# Patient Record
Sex: Male | Born: 1965 | Race: Black or African American | Hispanic: No | Marital: Married | Smoking: Current every day smoker
Health system: Southern US, Community
[De-identification: ages and names within clinical notes are randomized; demographics above are authoritative.]

## PROBLEM LIST (undated history)

## (undated) DIAGNOSIS — F4310 Post-traumatic stress disorder, unspecified: Secondary | ICD-10-CM

## (undated) DIAGNOSIS — R519 Headache, unspecified: Secondary | ICD-10-CM

## (undated) DIAGNOSIS — M1990 Unspecified osteoarthritis, unspecified site: Secondary | ICD-10-CM

## (undated) DIAGNOSIS — I10 Essential (primary) hypertension: Secondary | ICD-10-CM

## (undated) DIAGNOSIS — K219 Gastro-esophageal reflux disease without esophagitis: Secondary | ICD-10-CM

## (undated) HISTORY — PX: SHOULDER SURGERY: SHX246

## (undated) HISTORY — DX: Unspecified osteoarthritis, unspecified site: M19.90

## (undated) HISTORY — PX: FOOT SURGERY: SHX648

## (undated) HISTORY — DX: Gastro-esophageal reflux disease without esophagitis: K21.9

## (undated) HISTORY — PX: NECK SURGERY: SHX720

## (undated) HISTORY — DX: Headache, unspecified: R51.9

---

## 2021-05-13 ENCOUNTER — Ambulatory Visit (HOSPITAL_COMMUNITY)
Admission: EM | Admit: 2021-05-13 | Discharge: 2021-05-13 | Disposition: A | Discharge status: Home / Self Care | Payer: Self-pay

## 2021-05-13 ENCOUNTER — Other Ambulatory Visit (HOSPITAL_COMMUNITY): Payer: Self-pay

## 2021-05-13 ENCOUNTER — Encounter (HOSPITAL_COMMUNITY): Payer: Self-pay

## 2021-05-13 DIAGNOSIS — R109 Unspecified abdominal pain: Secondary | ICD-10-CM

## 2021-05-13 HISTORY — DX: Post-traumatic stress disorder, unspecified: F43.10

## 2021-05-13 HISTORY — DX: Essential (primary) hypertension: I10

## 2021-05-13 MED ORDER — KETOROLAC TROMETHAMINE 30 MG/ML IJ SOLN
30.0000 mg | Freq: Once | INTRAMUSCULAR | Status: AC
  Administered 2021-05-13: 30 mg via INTRAMUSCULAR

## 2021-05-13 MED ORDER — KETOROLAC TROMETHAMINE 30 MG/ML IJ SOLN
INTRAMUSCULAR | Status: AC
  Filled 2021-05-13: qty 1

## 2021-05-13 MED ORDER — DICLOFENAC SODIUM 75 MG PO TBEC
75.0000 mg | DELAYED_RELEASE_TABLET | Freq: Two times a day (BID) | ORAL | 0 refills | Status: DC
  Filled 2021-05-13: qty 30, 15d supply, fill #0

## 2021-05-13 MED ORDER — CYCLOBENZAPRINE HCL 10 MG PO TABS
10.0000 mg | ORAL_TABLET | Freq: Every day | ORAL | 0 refills | Status: DC
  Filled 2021-05-13: qty 10, 10d supply, fill #0

## 2021-05-13 NOTE — Discharge Instructions (Signed)
Your pain is most likely caused by irritation to the muscles or ligaments.   Take diclofenac twice a day for the next 5 days you may use medication as needed  You may use muscle relaxer at bedtime for additional comfort and to help you sleep  You may use heating pad in 15 minute intervals as needed for additional comfort, or you may find comfort in using ice in 10-15 minutes over affected area  Begin stretching affected area daily for 10 minutes as tolerated to further loosen muscles   When lying down place pillow underneath affected area for support  Can try sleeping without pillow on firm mattress   Practice good posture: head back, shoulders back, chest forward, pelvis back and weight distributed evenly on both legs  If pain persist after recommended treatment or reoccurs if may be beneficial to follow up with orthopedic specialist for evaluation, this doctor specializes in the bones and can manage your symptoms long-term with options such as but not limited to imaging, medications or physical therapy   A primary care referral has been placed for you so that your blood pressure to be managed or you can wait until you are seen at the [facility] hospital

## 2021-05-13 NOTE — ED Provider Notes (Signed)
[place]    CSN: [id number] Arrival date & time: 05/13/21  [id number]      History   Chief Complaint Chief Complaint  Patient presents with   side pain    left    HPI Wayne Bartlett is a 56 y.o. male.   Patient presents with constant left-sided flank pain for 2 days.  Symptoms began abruptly without precipitating event, injury or trauma.  Interfering with sleep, unable to lay directly onto left side.  Pain does not radiate.  Rating a 9 out of 10 described as a pulling sensation.  Has attempted use of Advil and Tiger balm which was not helpful.  History of hypertension, not currently on medication and PTSD.  Denies numbness, tingling, urinary or bowel changes, nausea, vomiting, diarrhea, fever, chills.   Past Medical History:  Diagnosis Date   Hypertension    PTSD (post-traumatic stress disorder)     There are no problems to display for this patient.   Past Surgical History:  Procedure Laterality Date   FOOT SURGERY     NECK SURGERY     SHOULDER SURGERY         Home Medications    Prior to Admission medications   Medication Sig Start Date End Date Taking? Authorizing Provider  calcium carbonate (OS-CAL) 1250 (500 Ca) MG chewable tablet Chew 1 tablet by mouth daily.   Yes [provider]  oxyCODONE-acetaminophen (PERCOCET) 10-325 MG tablet Take 1 tablet by mouth every 4 (four) hours as needed for pain.   Yes [provider]  potassium phosphate, monobasic, (K-PHOS ORIGINAL) 500 MG tablet Take 500 mg by mouth 4 (four) times daily -  with meals and at bedtime.   Yes [provider]    Family History Family History  Problem Relation Age of Onset   Alzheimer's disease Mother    Stroke Father     Social History Social History   Tobacco Use   Smoking status: Every Day    Types: Cigarettes, Cigars   Smokeless tobacco: Never  Substance Use Topics   Alcohol use: Yes     Allergies   Patient has no known  allergies.   Review of Systems Review of Systems  Constitutional: Negative.   HENT: Negative.    Respiratory: Negative.    Cardiovascular: Negative.   Gastrointestinal: Negative.   Genitourinary:  Positive for flank pain. Negative for decreased urine volume, difficulty urinating, dysuria, enuresis, frequency, genital sores, hematuria, penile discharge, penile pain, penile swelling, scrotal swelling, testicular pain and urgency.  Skin: Negative.   Neurological: Negative.   Psychiatric/Behavioral: Negative.      Physical Exam Triage Vital Signs ED Triage Vitals  Enc Vitals Group     BP 05/13/21 0837 (!) 162/96     Pulse Rate 05/13/21 0837 92     Resp 05/13/21 0837 14     Temp 05/13/21 0837 99.4 F (37.4 C)     Temp Source 05/13/21 0837 Oral     SpO2 05/13/21 0837 96 %     Weight 05/13/21 0838 210 lb (95.3 kg)     Height --      Head Circumference --      Peak Flow --      Pain Score 05/13/21 0838 6     Pain Loc --      Pain Edu? --      Excl. in [place]? --    No data found.  Updated Vital Signs BP (!) 162/96 (BP Location:  Right Arm)    Pulse 92    Temp 99.4 F (37.4 C) (Oral)    Resp 14    Wt 210 lb (95.3 kg)    SpO2 96%   Visual Acuity Right Eye Distance:   Left Eye Distance:   Bilateral Distance:    Right Eye Near:   Left Eye Near:    Bilateral Near:     Physical Exam Constitutional:      Appearance: Normal appearance. He is normal weight.  HENT:     Head: Normocephalic.  Eyes:     Extraocular Movements: Extraocular movements intact.  Cardiovascular:     Rate and Rhythm: Normal rate and regular rhythm.     Pulses: Normal pulses.     Heart sounds: Normal heart sounds.  Pulmonary:     Effort: Pulmonary effort is normal.     Breath sounds: Normal breath sounds.  Chest:       Comments: Point tenderness over the ribs 5 through 6 on the left flank underneath, no ecchymosis, crepitus, swelling or deformity noted  Abdominal:     General: Abdomen is flat.  Bowel sounds are normal.     Tenderness: There is no abdominal tenderness.  Skin:    General: Skin is warm and dry.  Neurological:     Mental Status: He is alert and oriented to person, place, and time. Mental status is at baseline.  Psychiatric:        Mood and Affect: Mood normal.        Behavior: Behavior normal.     UC Treatments / Results  Labs (all labs ordered are listed, but only abnormal results are displayed) Labs Reviewed - No data to display  EKG   Radiology No results found.  Procedures Procedures (including critical care time)  Medications Ordered in UC Medications - No data to display  Initial Impression / Assessment and Plan / UC Course  I have reviewed the triage vital signs and the nursing notes.  Pertinent labs & imaging results that were available during my care of the patient were reviewed by me and considered in my medical decision making (see chart for details).  Acute left flank pain  Low suspicion of involvement of the lungs, bladder, kidneys or abdomen will manage conservatively move forward with treatment as musculoskeletal pain, Toradol injection given in office, prescribed diclofenac and Flexeril for outpatient management, RICE recommended, heat in 15-minute intervals, pillows for support and daily stretching as tolerated, may follow-up with urgent care or orthopedic specialist for further evaluation as needed, PCP referral placed for management of blood pressure, blood pressure in triage 162/96, no signs of hypertensive emergency Final Clinical Impressions(s) / UC Diagnoses   Final diagnoses:  None   Discharge Instructions   None    ED Prescriptions   None    PDMP not reviewed this encounter.   Hans Eden, NP 05/13/21 [phone]

## 2021-05-13 NOTE — ED Triage Notes (Signed)
Pt presents with left side pain x 2 days. Pt taking advil for pain. Pt states he takes medication for HTN, PTSD but is unsure of name/dose

## 2021-09-18 ENCOUNTER — Other Ambulatory Visit (HOSPITAL_BASED_OUTPATIENT_CLINIC_OR_DEPARTMENT_OTHER): Payer: Self-pay | Admitting: Internal Medicine

## 2021-09-18 ENCOUNTER — Other Ambulatory Visit (HOSPITAL_COMMUNITY): Payer: Self-pay | Admitting: Internal Medicine

## 2021-09-18 DIAGNOSIS — M25511 Pain in right shoulder: Secondary | ICD-10-CM

## 2021-09-26 ENCOUNTER — Ambulatory Visit (HOSPITAL_BASED_OUTPATIENT_CLINIC_OR_DEPARTMENT_OTHER)
Admission: RE | Admit: 2021-09-26 | Discharge: 2021-09-26 | Disposition: A | Discharge status: Home / Self Care | Payer: No Typology Code available for payment source | Source: Ambulatory Visit | Attending: Internal Medicine | Admitting: Internal Medicine

## 2021-09-26 DIAGNOSIS — M25511 Pain in right shoulder: Secondary | ICD-10-CM | POA: Diagnosis not present

## 2022-05-29 NOTE — Therapy (Incomplete)
OUTPATIENT PHYSICAL THERAPY SHOULDER EVALUATION   Patient Name: Wayne Bartlett MRN: [id number] DOB:02-22-1966, 57 y.o., male Today's Date: 06/01/2022  END OF SESSION:   Past Medical History:  Diagnosis Date   Hypertension    PTSD (post-traumatic stress disorder)    Past Surgical History:  Procedure Laterality Date   FOOT SURGERY     NECK SURGERY     SHOULDER SURGERY     There are no problems to display for this patient.   PCP: none in chart  REFERRING PROVIDER: Diamond Nickel, PA-C   REFERRING DIAG: right shoulder pain   THERAPY DIAG:  No diagnosis found.  Rationale for Evaluation and Treatment: Rehabilitation  ONSET DATE: 04/22/22 s/p R shoulder arthroscopy + balloon arthroplasty  SUBJECTIVE:                                                                                                                                                                                      SUBJECTIVE STATEMENT: ***  PERTINENT HISTORY: Unremarkable per chart review  PAIN:  Are you having pain: *** Location: *** How would you describe your pain? *** Best in past week: *** Worst in past week: *** Aggravating factors: *** Easing factors: ***   PRECAUTIONS: per referral: PROM POD0 AAROM at 4 weeks (05/20/22) AROM 6 weeks 06/03/22  WEIGHT BEARING RESTRICTIONS: No  FALLS:  Has patient fallen in last 6 months? {fallsyesno:27318}  LIVING ENVIRONMENT: Lives with: {[facility] lives with:25569::"lives with their family"} Lives in: {Lives in:25570} Stairs: {opstairs:27293} Has following equipment at home: {Assistive devices:23999}  OCCUPATION: ***  PLOF: Independent  PATIENT GOALS:***  NEXT MD VISIT:   OBJECTIVE:   DIAGNOSTIC FINDINGS:  S/p R shoulder arthroscopy + balloon arthroplasty Apr 22 2022  PATIENT SURVEYS:  FOTO ***  COGNITION: Overall cognitive status: Within functional limits for tasks assessed     SENSATION: Light touch intact B  UE  POSTURE: ***  UPPER EXTREMITY ROM:  A/PROM Right eval Left eval  Shoulder flexion    Shoulder abduction    Shoulder internal rotation    Shoulder external rotation    Elbow flexion    Elbow extension    Wrist flexion    Wrist extension     (Blank rows = not tested) (Key: WFL = within functional limits not formally assessed, * = concordant pain, s = stiffness/stretching sensation, NT = not tested)  Comments: AROM deferred on eval given post surgical restrictions  UPPER EXTREMITY MMT:  MMT Right eval Left eval  Shoulder flexion    Shoulder extension    Shoulder abduction    Shoulder extension    Shoulder internal rotation    Shoulder  external rotation    Elbow flexion    Elbow extension    Grip strength    (Blank rows = not tested)  (Key: WFL = within functional limits not formally assessed, * = concordant pain, s = stiffness/stretching sensation, NT = not tested)  Comments: MMT deferred on eval given proximity of surgery  SHOULDER SPECIAL TESTS: NT given post surgical status  JOINT MOBILITY TESTING:  NT given post surgical status   PALPATION:  ***   TODAY'S TREATMENT:                                                                                                                                         [facility] Adult PT Treatment:                                                DATE: 06/01/22 Therapeutic Exercise: *** Manual Therapy: *** Neuromuscular re-ed: *** Therapeutic Activity: *** Modalities: *** Self Care: ***  PATIENT EDUCATION: Education details: Pt education on PT impairments, prognosis, and POC. Informed consent. Rationale for interventions, safe/appropriate HEP performance Person educated: Patient Education method: Explanation, Demonstration, Tactile cues, Verbal cues, and Handouts Education comprehension: verbalized understanding, returned demonstration, verbal cues required, tactile cues required, and needs further education    HOME  EXERCISE PROGRAM: ***  ASSESSMENT:  CLINICAL IMPRESSION: Patient is a 57 y.o. gentleman who was seen today for physical therapy evaluation and treatment s/p R shoulder scope + balloon arthroplasty on 04/22/22. ***   OBJECTIVE IMPAIRMENTS: {opptimpairments:25111}.   ACTIVITY LIMITATIONS: {activitylimitations:27494}  PARTICIPATION LIMITATIONS: {participationrestrictions:25113}  PERSONAL FACTORS: {Personal factors:25162} are also affecting patient's functional outcome.   REHAB POTENTIAL: {rehabpotential:25112}  CLINICAL DECISION MAKING: {clinical decision making:25114}  EVALUATION COMPLEXITY: {Evaluation complexity:25115}   GOALS: Goals reviewed with patient? {yes/no:20286}  SHORT TERM GOALS: Target date: 06/29/2022 Pt will demonstrate appropriate understanding and performance of initially prescribed HEP in order to facilitate improved independence with management of symptoms.  Baseline: HEP provided on eval Goal status: INITIAL   2. Pt will score greater than or equal to *** on FOTO in order to demonstrate improved perception of function due to symptoms.  Baseline: ***  Goal status: INITIAL    LONG TERM GOALS: Target date: 07/27/2022 Pt will score *** on FOTO in order to demonstrate improved perception of function due to symptoms. Baseline: *** Goal status: INITIAL  2.  Pt will demonstrate at least *** degrees of active shoulder elevation in order to demonstrate improved tolerance to functional movement patterns such as ***  Baseline: AROM deferred on eval given post surgical restrictions Goal status: INITIAL  3.  Pt will demonstrate at least 4+/5 shoulder *** MMT for improved symmetry of UE strength and improved tolerance to functional movements.  Baseline: MMT  Goal status:  INITIAL  4. Pt will report/demonstrate ability to perform *** with less than 2 point increase in pain on NPS in order to indicative improved tolerance/independence with functional tasks such as ***.    Baseline: ***  Goal status: INITIAL   PLAN:  PT FREQUENCY: 1-2x/week  PT DURATION: 8 weeks  PLANNED INTERVENTIONS: {rehab planned interventions:25118::"Therapeutic exercises","Therapeutic activity","Neuromuscular re-education","Balance training","Gait training","Patient/Family education","Self Care","Joint mobilization"}  PLAN FOR NEXT SESSION: Progress ROM/strengthening exercises as able/appropriate, review HEP.    Leeroy Cha PT, DPT 06/01/2022 8:35 AM

## 2022-06-01 ENCOUNTER — Ambulatory Visit: Payer: No Typology Code available for payment source | Attending: Physical Therapy | Admitting: Physical Therapy

## 2022-06-04 ENCOUNTER — Other Ambulatory Visit: Payer: Self-pay

## 2022-06-04 ENCOUNTER — Ambulatory Visit: Payer: No Typology Code available for payment source | Attending: Physician Assistant

## 2022-06-04 DIAGNOSIS — M25511 Pain in right shoulder: Secondary | ICD-10-CM | POA: Insufficient documentation

## 2022-06-04 DIAGNOSIS — M6281 Muscle weakness (generalized): Secondary | ICD-10-CM | POA: Insufficient documentation

## 2022-06-04 DIAGNOSIS — R600 Localized edema: Secondary | ICD-10-CM | POA: Insufficient documentation

## 2022-06-04 NOTE — Therapy (Signed)
OUTPATIENT PHYSICAL THERAPY SHOULDER EVALUATION   Patient Name: Wayne Bartlett MRN: [id number] DOB:12/20/1965, 57 y.o., male Today's Date: 06/04/2022  END OF SESSION:  PT End of Session - 06/04/22 1649     Visit Number 1    Number of Visits 15    Date for PT Re-Evaluation 07/30/22    Authorization Type [facility]    PT Start Time 1445    PT Stop Time 1525    PT Time Calculation (min) 40 min    Activity Tolerance Patient tolerated treatment well    Behavior During Therapy WFL for tasks assessed/performed             Past Medical History:  Diagnosis Date   Hypertension    PTSD (post-traumatic stress disorder)    Past Surgical History:  Procedure Laterality Date   FOOT SURGERY     NECK SURGERY     SHOULDER SURGERY     There are no problems to display for this patient.   [facility]  REFERRING PROVIDER: Blair Hailey, PA-C  REFERRING DIAG: right shoulder pain   THERAPY DIAG:  Acute pain of right shoulder  Muscle weakness (generalized)  Localized edema  Rationale for Evaluation and Treatment: Rehabilitation  ONSET DATE: 04/23/2023  SUBJECTIVE:                                                                                                                                                                                      SUBJECTIVE STATEMENT: Pt presents to PT s/p R shoulder arthroscopy with balloon arthoplasty performed on 04/22/2022. He notes that he has generally been doing well, but he has had more pain this week after seeing his doctor and demonstrating his ROM. Denies N/T down R UE, did have increased pain when he woke up on his R side. Has been cleared out of sling and is now 6 weeks post-op, can begin AAROM and AROM per faxed protocol.  PERTINENT HISTORY: HTN, PTSD, Cervical fusion, L RTC repair  PAIN:  Are you having pain?  Yes: NPRS scale: 4/10 Worst: 9/10  Pain location: right shoulder  Pain description: tight, sore Aggravating factors: laying on R  side Relieving factors: rest  PRECAUTIONS: None  WEIGHT BEARING RESTRICTIONS: No  FALLS:  Has patient fallen in last 6 months? 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, improve ROM to make ADLs easier   OBJECTIVE:   DIAGNOSTIC FINDINGS:  N/A  PATIENT SURVEYS:  FOTO: 43% function; 63% predicted  COGNITION: Overall cognitive status: Within functional limits for tasks  assessed     SENSATION: WFL  POSTURE: Rounded shoulders, fwd head  UPPER EXTREMITY ROM:   AROM/PROM Right AROM Right PROM  Shoulder flexion 85 125  Shoulder extension    Shoulder abduction 70 90  Shoulder adduction    Shoulder internal rotation  70  Shoulder external rotation  40  Elbow flexion    Elbow extension    Wrist flexion    Wrist extension    Wrist ulnar deviation    Wrist radial deviation    Wrist pronation    Wrist supination    (Blank rows = not tested)  UPPER EXTREMITY MMT:  MMT Right eval Left eval  Shoulder flexion    Shoulder extension    Shoulder abduction    Shoulder adduction    Shoulder internal rotation    Shoulder external rotation    Middle trapezius    Lower trapezius    Elbow flexion    Elbow extension    Wrist flexion    Wrist extension    Wrist ulnar deviation    Wrist radial deviation    Wrist pronation    Wrist supination    Grip strength (lbs)    (Blank rows = not tested)  SHOULDER SPECIAL TESTS: DNT  JOINT MOBILITY TESTING:  DNT  PALPATION:  Slight TTP to surgical site during PROM   TREATMENT: [facility] Adult PT Treatment:                                                DATE: 06/04/2022 Therapeutic Exercise: Supine AAROM with dowel shoulder flexion x 5 Row x 10 BTB R shoulder flex/abd table slide x 5  PATIENT EDUCATION: Education details: eval findings, FOTO, HEP, POC Person educated:  Patient Education method: Explanation, Demonstration, and Handouts Education comprehension: verbalized understanding and returned demonstration  HOME EXERCISE PROGRAM: Access Code: [email or web address] URL: [email or web address] Date: 06/04/2022 Prepared by: Octavio Manns  Exercises - Supine Shoulder Flexion Extension AAROM with Dowel  - 2-3 x daily - 7 x weekly - 2 sets - 10 reps - Standing Shoulder Row with Anchored Resistance  - 2-3 x daily - 7 x weekly - 3 sets - 10 reps - blue band hold - Seated Shoulder Flexion Towel Slide at Table Top  - 2-3 x daily - 7 x weekly - 2 sets - 10 reps - 5 sec hold - Seated Shoulder Abduction Towel Slide at Table Top  - 2-3 x daily - 7 x weekly - 2 sets - 10 reps - 5 sec hold  ASSESSMENT:  CLINICAL IMPRESSION: Patient is a 57 y.o. M who was seen today for physical therapy evaluation and treatment s/p R shoulder arthroscopy with balloon arthoplasty performed on 04/22/2022. Physical findings are   OBJECTIVE IMPAIRMENTS: decreased activity tolerance, decreased mobility, decreased ROM, decreased strength, and pain.   ACTIVITY LIMITATIONS: carrying, lifting, bathing, dressing, and reach over head  PARTICIPATION LIMITATIONS: driving, shopping, community activity, occupation, and yard work  PERSONAL FACTORS: 1-2 comorbidities: HTN, PTSD, Cervical fusion, L RTC repair  are also affecting patient's functional outcome.   REHAB POTENTIAL: Excellent  CLINICAL DECISION MAKING: Stable/uncomplicated  EVALUATION COMPLEXITY: Low   GOALS: Goals reviewed with patient? No  SHORT TERM GOALS: Target date: 06/25/2022   Pt will be compliant and knowledgeable with initial HEP for improved comfort and carryover Baseline: initial HEP given  Goal status:  INITIAL  2.  Pt will self report right shoulder pain no greater than 6/10 for improved comfort and functional ability Baseline: 10/10 at worst Goal status: INITIAL   LONG TERM GOALS: Target date: 07/30/2022    Pt will improve FOTO function score to no less than 63% as proxy for functional improvement Baseline: 43% function Goal status: INITIAL   2.  Pt will self report right shoulder pain no greater than 2/10 for improved comfort and functional ability Baseline: 9/10 at worst Goal status: INITIAL   3.  Pt will improve R shoulder AROM into flex/abd to no less than 145 for improved functional ability with home ADLs and community activity Baseline: see chart Goal status: INITIAL  4.  Pt will be able to play drums not limited by R shoulder pain for improved comfort and getting back to desired recreational activity Baseline: unable Goal status: INITIAL  PLAN:  PT FREQUENCY: 2x/week  PT DURATION: 8 weeks  PLANNED INTERVENTIONS: Therapeutic exercises, Therapeutic activity, Neuromuscular re-education, Balance training, Gait training, Patient/Family education, Self Care, Joint mobilization, Dry Needling, Electrical stimulation, Cryotherapy, Moist heat, Vasopneumatic device, Manual therapy, and Re-evaluation  PLAN FOR NEXT SESSION: assess HEP response, progress per protocol    Ward Chatters, PT 06/04/2022, 4:51 PM

## 2022-06-13 ENCOUNTER — Ambulatory Visit: Payer: No Typology Code available for payment source

## 2022-06-13 DIAGNOSIS — M6281 Muscle weakness (generalized): Secondary | ICD-10-CM

## 2022-06-13 DIAGNOSIS — R600 Localized edema: Secondary | ICD-10-CM

## 2022-06-13 DIAGNOSIS — M25511 Pain in right shoulder: Secondary | ICD-10-CM | POA: Diagnosis not present

## 2022-06-13 NOTE — Therapy (Signed)
OUTPATIENT PHYSICAL THERAPY TREATMENT NOTE   Patient Name: Wayne Bartlett MRN: [id number] DOB:07/30/1965, 57 y.o., male Today's Date: 06/13/2022  [facility]  REFERRING PROVIDER: Blair Hailey, PA-C   END OF SESSION:   PT End of Session - 06/13/22 0928     Visit Number 2    Number of Visits 15    Date for PT Re-Evaluation 07/30/22    [facility]ization Type [facility]    PT Start Time 0945    PT Stop Time 1025    PT Time Calculation (min) 40 min    Activity Tolerance Patient tolerated treatment well    Behavior During Therapy WFL for tasks assessed/performed             Past Medical History:  Diagnosis Date   Hypertension    PTSD (post-traumatic stress disorder)    Past Surgical History:  Procedure Laterality Date   FOOT SURGERY     NECK SURGERY     SHOULDER SURGERY     There are no problems to display for this patient.   REFERRING DIAG: right shoulder pain    THERAPY DIAG:  Acute pain of right shoulder  Muscle weakness (generalized)  Localized edema  Rationale for Evaluation and Treatment Rehabilitation  PERTINENT HISTORY: HTN, PTSD, Cervical fusion, L RTC repair   PRECAUTIONS: None  SUBJECTIVE:                                                                                                                                                                                      SUBJECTIVE STATEMENT:  Patient reports increased soreness today, on the lateral portion of his shoulder.   PAIN:  Are you having pain?  Yes: NPRS scale: 7/10 Worst: 9/10  Pain location: right shoulder  Pain description: tight, sore Aggravating factors: laying on R side Relieving factors: rest   OBJECTIVE: (objective measures completed at initial evaluation unless otherwise dated)   DIAGNOSTIC FINDINGS:  N/A   PATIENT SURVEYS:  FOTO: 43% function; 63% predicted   COGNITION: Overall cognitive status: Within functional limits for tasks assessed                                   SENSATION: WFL   POSTURE: Rounded shoulders, fwd head   UPPER EXTREMITY ROM:    AROM/PROM Right AROM Right PROM  Shoulder flexion 85 125  Shoulder extension      Shoulder abduction 70 90  Shoulder adduction      Shoulder internal rotation   70  Shoulder external rotation   40  Elbow flexion  Elbow extension      Wrist flexion      Wrist extension      Wrist ulnar deviation      Wrist radial deviation      Wrist pronation      Wrist supination      (Blank rows = not tested)   UPPER EXTREMITY MMT:   MMT Right eval Left eval  Shoulder flexion      Shoulder extension      Shoulder abduction      Shoulder adduction      Shoulder internal rotation      Shoulder external rotation      Middle trapezius      Lower trapezius      Elbow flexion      Elbow extension      Wrist flexion      Wrist extension      Wrist ulnar deviation      Wrist radial deviation      Wrist pronation      Wrist supination      Grip strength (lbs)      (Blank rows = not tested)   SHOULDER SPECIAL TESTS: DNT   JOINT MOBILITY TESTING:  DNT   PALPATION:  Slight TTP to surgical site during PROM             TREATMENT: [facility] Adult PT Treatment:                                                DATE: 06/13/22 Therapeutic Exercise: Rows BlueTB 2x10 Shoulder extension GTB 2x10 Supine AAROM with dowel 2x10 Supine chest press AAROM with dowel 2x10 Supine elbow flexion/extension AROM Rt 500g ball pronated supinated 2x10 each Sidelying abduction Rt 500g ball 2x10 (small ROM, painful) Seated scaption with dowel <90 2x10 Sidelying ER Rt 500g ball 2x10 Modalities: Vasopneumatic (Game Ready)   Location:  right shoulder Time:  10 minutes Pressure:  low Temperature:  34 degrees   [facility] Adult PT Treatment:                                                DATE: 06/04/2022 Therapeutic Exercise: Supine AAROM with dowel shoulder flexion x 5 Row x 10 BTB R shoulder flex/abd table slide x  5   PATIENT EDUCATION: Education details: eval findings, FOTO, HEP, POC Person educated: Patient Education method: Explanation, Demonstration, and Handouts Education comprehension: verbalized understanding and returned demonstration   HOME EXERCISE PROGRAM: Access Code: [email or web address] URL: [email or web address] Date: 06/04/2022 Prepared by: Octavio Manns   Exercises - Supine Shoulder Flexion Extension AAROM with Dowel  - 2-3 x daily - 7 x weekly - 2 sets - 10 reps - Standing Shoulder Row with Anchored Resistance  - 2-3 x daily - 7 x weekly - 3 sets - 10 reps - blue band hold - Seated Shoulder Flexion Towel Slide at Table Top  - 2-3 x daily - 7 x weekly - 2 sets - 10 reps - 5 sec hold - Seated Shoulder Abduction Towel Slide at Table Top  - 2-3 x daily - 7 x weekly - 2 sets - 10 reps - 5 sec hold   ASSESSMENT:   CLINICAL IMPRESSION: Patient presents to  PT with increased pain in his Rt shoulder and reports HEP compliance. Session today focused on progressing RTC and periscapular strengthening as well as AAROM -> AROM in supine. Most difficult motion was sidelying abduction, increased pain and small ROM. He requires minimal cues with seated scaption AAROM to decrease compensation of shoulder elevation and trunk lean. Patient was able to tolerate all prescribed exercises with no adverse effects. Patient continues to benefit from skilled PT services and should be progressed as able to improve functional independence.     OBJECTIVE IMPAIRMENTS: decreased activity tolerance, decreased mobility, decreased ROM, decreased strength, and pain.    ACTIVITY LIMITATIONS: carrying, lifting, bathing, dressing, and reach over head   PARTICIPATION LIMITATIONS: driving, shopping, community activity, occupation, and yard work   PERSONAL FACTORS: 1-2 comorbidities: HTN, PTSD, Cervical fusion, L RTC repair  are also affecting patient's functional outcome.    REHAB POTENTIAL: Excellent   CLINICAL  DECISION MAKING: Stable/uncomplicated   EVALUATION COMPLEXITY: Low     GOALS: Goals reviewed with patient? No   SHORT TERM GOALS: Target date: 06/25/2022   Pt will be compliant and knowledgeable with initial HEP for improved comfort and carryover Baseline: initial HEP given  Goal status: MET Pt reports adherence 06/13/22   2.  Pt will self report right shoulder pain no greater than 6/10 for improved comfort and functional ability Baseline: 10/10 at worst Goal status: INITIAL    LONG TERM GOALS: Target date: 07/30/2022   Pt will improve FOTO function score to no less than 63% as proxy for functional improvement Baseline: 43% function Goal status: INITIAL    2.  Pt will self report right shoulder pain no greater than 2/10 for improved comfort and functional ability Baseline: 9/10 at worst Goal status: INITIAL    3.  Pt will improve R shoulder AROM into flex/abd to no less than 145 for improved functional ability with home ADLs and community activity Baseline: see chart Goal status: INITIAL   4.  Pt will be able to play drums not limited by R shoulder pain for improved comfort and getting back to desired recreational activity Baseline: unable Goal status: INITIAL   PLAN:   PT FREQUENCY: 2x/week   PT DURATION: 8 weeks   PLANNED INTERVENTIONS: Therapeutic exercises, Therapeutic activity, Neuromuscular re-education, Balance training, Gait training, Patient/Family education, Self Care, Joint mobilization, Dry Needling, Electrical stimulation, Cryotherapy, Moist heat, Vasopneumatic device, Manual therapy, and Re-evaluation   PLAN FOR NEXT SESSION: assess HEP response, progress per protocol    Margarette Canada, PTA 06/13/2022, 9:31 AM

## 2022-06-16 ENCOUNTER — Ambulatory Visit: Payer: No Typology Code available for payment source

## 2022-06-16 DIAGNOSIS — M6281 Muscle weakness (generalized): Secondary | ICD-10-CM

## 2022-06-16 DIAGNOSIS — M25511 Pain in right shoulder: Secondary | ICD-10-CM

## 2022-06-16 DIAGNOSIS — R600 Localized edema: Secondary | ICD-10-CM

## 2022-06-16 NOTE — Therapy (Signed)
OUTPATIENT PHYSICAL THERAPY TREATMENT NOTE   Patient Name: Wayne Bartlett MRN: [id number] DOB:02-Apr-1966, 57 y.o., male Today's Date: 06/16/2022  [facility]  REFERRING PROVIDER: Blair Hailey, PA-C   END OF SESSION:   PT End of Session - 06/16/22 0917     Visit Number 3    Number of Visits 15    Date for PT Re-Evaluation 07/30/22    [facility]    Authorization Time Period 15 visits 05/12/22-09/09/22    Authorization - Visit Number 2    Authorization - Number of Visits 15    PT Start Time [phone]    PT Stop Time 1006    PT Time Calculation (min) 50 min    Activity Tolerance Patient tolerated treatment well    Behavior During Therapy WFL for tasks assessed/performed              Past Medical History:  Diagnosis Date   Hypertension    PTSD (post-traumatic stress disorder)    Past Surgical History:  Procedure Laterality Date   FOOT SURGERY     NECK SURGERY     SHOULDER SURGERY     There are no problems to display for this patient.   REFERRING DIAG: right shoulder pain    THERAPY DIAG:  Acute pain of right shoulder  Muscle weakness (generalized)  Localized edema  Rationale for Evaluation and Treatment Rehabilitation  PERTINENT HISTORY: HTN, PTSD, Cervical fusion, L RTC repair   PRECAUTIONS: None  SUBJECTIVE:                                                                                                                                                                                      SUBJECTIVE STATEMENT:  Patient reports increased soreness from weather last night.   PAIN:  Are you having pain?  Yes: NPRS scale: 5-6/10 Worst: 9/10  Pain location: right shoulder  Pain description: tight, sore Aggravating factors: laying on R side Relieving factors: rest   OBJECTIVE: (objective measures completed at initial evaluation unless otherwise dated)   DIAGNOSTIC FINDINGS:  N/A   PATIENT SURVEYS:  FOTO: 43% function; 63% predicted    COGNITION: Overall cognitive status: Within functional limits for tasks assessed                                  SENSATION: WFL   POSTURE: Rounded shoulders, fwd head   UPPER EXTREMITY ROM:    AROM/PROM Right AROM Right PROM  Shoulder flexion 85 125  Shoulder extension      Shoulder abduction 70 90  Shoulder adduction  Shoulder internal rotation   70  Shoulder external rotation   40  Elbow flexion      Elbow extension      Wrist flexion      Wrist extension      Wrist ulnar deviation      Wrist radial deviation      Wrist pronation      Wrist supination      (Blank rows = not tested)   UPPER EXTREMITY MMT:   MMT Right eval Left eval  Shoulder flexion      Shoulder extension      Shoulder abduction      Shoulder adduction      Shoulder internal rotation      Shoulder external rotation      Middle trapezius      Lower trapezius      Elbow flexion      Elbow extension      Wrist flexion      Wrist extension      Wrist ulnar deviation      Wrist radial deviation      Wrist pronation      Wrist supination      Grip strength (lbs)      (Blank rows = not tested)   SHOULDER SPECIAL TESTS: DNT   JOINT MOBILITY TESTING:  DNT   PALPATION:  Slight TTP to surgical site during PROM             TREATMENT: [facility] Adult PT Treatment:                                                DATE: 06/16/22 Therapeutic Exercise: Rows BlueTB 2x10 Shoulder extension GTB 2x10 Supine flexion AAROM with dowel 2x10 Supine chest press with dowel 3# 2x15 Supine elbow flexion/extension AROM Rt 1000g ball pronated supinated 2x10 each Sidelying abduction Rt 2x10 (small ROM, painful) Sidelying ER Rt 500g ball 2x10 Modalities: Vasopneumatic (Game Ready)   Location:  right shoulder Time:  10 minutes Pressure:  low Temperature:  34 degrees  [facility] Adult PT Treatment:                                                DATE: 06/13/22 Therapeutic Exercise: Rows BlueTB  2x10 Shoulder extension GTB 2x10 Supine AAROM with dowel 2x10 Supine chest press AAROM with dowel 2x10 Supine elbow flexion/extension AROM Rt 500g ball pronated supinated 2x10 each Sidelying abduction Rt 500g ball 2x10 (small ROM, painful) Seated scaption with dowel <90 2x10 Sidelying ER Rt 500g ball 2x10 Modalities: Vasopneumatic (Game Ready)   Location:  right shoulder Time:  10 minutes Pressure:  low Temperature:  34 degrees   [facility] Adult PT Treatment:                                                DATE: 06/04/2022 Therapeutic Exercise: Supine AAROM with dowel shoulder flexion x 5 Row x 10 BTB R shoulder flex/abd table slide x 5   PATIENT EDUCATION: Education details: eval findings, FOTO, HEP, POC Person educated: Patient Education method: Explanation, Demonstration, and  Handouts Education comprehension: verbalized understanding and returned demonstration   HOME EXERCISE PROGRAM: Access Code: [email or web address] URL: [email or web address] Date: 06/04/2022 Prepared by: Octavio Manns   Exercises - Supine Shoulder Flexion Extension AAROM with Dowel  - 2-3 x daily - 7 x weekly - 2 sets - 10 reps - Standing Shoulder Row with Anchored Resistance  - 2-3 x daily - 7 x weekly - 3 sets - 10 reps - blue band hold - Seated Shoulder Flexion Towel Slide at Table Top  - 2-3 x daily - 7 x weekly - 2 sets - 10 reps - 5 sec hold - Seated Shoulder Abduction Towel Slide at Table Top  - 2-3 x daily - 7 x weekly - 2 sets - 10 reps - 5 sec hold   ASSESSMENT:   CLINICAL IMPRESSION: Patient presents to PT with continued pain in his Rt shoulder and reports HEP compliance. Session today continued to focus RTC and periscapular strengthening as well as AAROM/AROM. Abduction remains most limited and painful at this time, flexion shows visually improved ROM and decreased pain today. Patient was able to tolerate all prescribed exercises with no adverse effects. Patient continues to benefit from skilled  PT services and should be progressed as able to improve functional independence.     OBJECTIVE IMPAIRMENTS: decreased activity tolerance, decreased mobility, decreased ROM, decreased strength, and pain.    ACTIVITY LIMITATIONS: carrying, lifting, bathing, dressing, and reach over head   PARTICIPATION LIMITATIONS: driving, shopping, community activity, occupation, and yard work   PERSONAL FACTORS: 1-2 comorbidities: HTN, PTSD, Cervical fusion, L RTC repair  are also affecting patient's functional outcome.    REHAB POTENTIAL: Excellent   CLINICAL DECISION MAKING: Stable/uncomplicated   EVALUATION COMPLEXITY: Low     GOALS: Goals reviewed with patient? No   SHORT TERM GOALS: Target date: 06/25/2022   Pt will be compliant and knowledgeable with initial HEP for improved comfort and carryover Baseline: initial HEP given  Goal status: MET Pt reports adherence 06/13/22   2.  Pt will self report right shoulder pain no greater than 6/10 for improved comfort and functional ability Baseline: 10/10 at worst Goal status: Ongoing   LONG TERM GOALS: Target date: 07/30/2022   Pt will improve FOTO function score to no less than 63% as proxy for functional improvement Baseline: 43% function Goal status: INITIAL    2.  Pt will self report right shoulder pain no greater than 2/10 for improved comfort and functional ability Baseline: 9/10 at worst Goal status: INITIAL    3.  Pt will improve R shoulder AROM into flex/abd to no less than 145 for improved functional ability with home ADLs and community activity Baseline: see chart Goal status: INITIAL   4.  Pt will be able to play drums not limited by R shoulder pain for improved comfort and getting back to desired recreational activity Baseline: unable Goal status: INITIAL   PLAN:   PT FREQUENCY: 2x/week   PT DURATION: 8 weeks   PLANNED INTERVENTIONS: Therapeutic exercises, Therapeutic activity, Neuromuscular re-education, Balance  training, Gait training, Patient/Family education, Self Care, Joint mobilization, Dry Needling, Electrical stimulation, Cryotherapy, Moist heat, Vasopneumatic device, Manual therapy, and Re-evaluation   PLAN FOR NEXT SESSION: assess HEP response, progress per protocol    Margarette Canada, PTA 06/16/2022, 9:57 AM

## 2022-06-18 ENCOUNTER — Ambulatory Visit: Payer: No Typology Code available for payment source

## 2022-06-18 DIAGNOSIS — R600 Localized edema: Secondary | ICD-10-CM

## 2022-06-18 DIAGNOSIS — M25511 Pain in right shoulder: Secondary | ICD-10-CM | POA: Diagnosis not present

## 2022-06-18 DIAGNOSIS — M6281 Muscle weakness (generalized): Secondary | ICD-10-CM

## 2022-06-18 NOTE — Therapy (Signed)
OUTPATIENT PHYSICAL THERAPY TREATMENT NOTE   Patient Name: Wayne Bartlett MRN: [id number] DOB:05-07-1965, 57 y.o., male Today's Date: 06/18/2022  [facility]  REFERRING PROVIDER: Blair Hailey, PA-C   END OF SESSION:   PT End of Session - 06/18/22 0911     Visit Number 4    Number of Visits 15    Date for PT Re-Evaluation 07/30/22    [facility]    Authorization Time Period 15 visits 05/12/22-09/09/22    Authorization - Visit Number 3    Authorization - Number of Visits 15    PT Start Time 0915    PT Stop Time 1006    PT Time Calculation (min) 51 min    Activity Tolerance Patient tolerated treatment well    Behavior During Therapy WFL for tasks assessed/performed               Past Medical History:  Diagnosis Date   Hypertension    PTSD (post-traumatic stress disorder)    Past Surgical History:  Procedure Laterality Date   FOOT SURGERY     NECK SURGERY     SHOULDER SURGERY     There are no problems to display for this patient.   REFERRING DIAG: right shoulder pain    THERAPY DIAG:  Acute pain of right shoulder  Muscle weakness (generalized)  Localized edema  Rationale for Evaluation and Treatment Rehabilitation  PERTINENT HISTORY: HTN, PTSD, Cervical fusion, L RTC repair   PRECAUTIONS: None  SUBJECTIVE:                                                                                                                                                                                      SUBJECTIVE STATEMENT:  Patient reports continued pain, only has trouble sleeping if he rolls onto his Rt side.    PAIN:  Are you having pain?  Yes: NPRS scale: 6/10 Worst: 9/10  Pain location: right shoulder  Pain description: tight, sore Aggravating factors: laying on R side Relieving factors: rest   OBJECTIVE: (objective measures completed at initial evaluation unless otherwise dated)   DIAGNOSTIC FINDINGS:  N/A   PATIENT SURVEYS:  FOTO: 43%  function; 63% predicted   COGNITION: Overall cognitive status: Within functional limits for tasks assessed                                  SENSATION: WFL   POSTURE: Rounded shoulders, fwd head   UPPER EXTREMITY ROM:    AROM/PROM Right AROM Right PROM  Shoulder flexion 85 125  Shoulder extension  Shoulder abduction 70 90  Shoulder adduction      Shoulder internal rotation   70  Shoulder external rotation   40  Elbow flexion      Elbow extension      Wrist flexion      Wrist extension      Wrist ulnar deviation      Wrist radial deviation      Wrist pronation      Wrist supination      (Blank rows = not tested)   UPPER EXTREMITY MMT:   MMT Right eval Left eval  Shoulder flexion      Shoulder extension      Shoulder abduction      Shoulder adduction      Shoulder internal rotation      Shoulder external rotation      Middle trapezius      Lower trapezius      Elbow flexion      Elbow extension      Wrist flexion      Wrist extension      Wrist ulnar deviation      Wrist radial deviation      Wrist pronation      Wrist supination      Grip strength (lbs)      (Blank rows = not tested)   SHOULDER SPECIAL TESTS: DNT   JOINT MOBILITY TESTING:  DNT   PALPATION:  Slight TTP to surgical site during PROM             TREATMENT: [facility] Adult PT Treatment:                                                DATE: 06/18/22 Therapeutic Exercise: Rows BlueTB 2x10 Shoulder extension GTB 2x10 Supine flexion AAROM with dowel 2x10 Supine chest press with dowel 3# 2x15 Sidelying abduction Rt 2x10 (small ROM, <60) Manual: PROM flexion/abduction Glenohumeral joint mobs AP/inf grades I-III Modalities: Vasopneumatic (Game Ready)   Location:  right shoulder Time:  10 minutes Pressure:  low Temperature:  34 degrees   [facility] Adult PT Treatment:                                                DATE: 06/16/22 Therapeutic Exercise: Rows BlueTB 2x10 Shoulder  extension GTB 2x10 Supine flexion AAROM with dowel 2x10 Supine chest press with dowel 3# 2x15 Supine elbow flexion/extension AROM Rt 1000g ball pronated supinated 2x10 each Sidelying abduction Rt 2x10 (small ROM, painful) Sidelying ER Rt 500g ball 2x10 Modalities: Vasopneumatic (Game Ready)   Location:  right shoulder Time:  10 minutes Pressure:  low Temperature:  34 degrees  [facility] Adult PT Treatment:                                                DATE: 06/13/22 Therapeutic Exercise: Rows BlueTB 2x10 Shoulder extension GTB 2x10 Supine AAROM with dowel 2x10 Supine chest press AAROM with dowel 2x10 Supine elbow flexion/extension AROM Rt 500g ball pronated supinated 2x10 each Sidelying abduction Rt 500g ball 2x10 (small ROM, painful) Seated scaption  with dowel <90 2x10 Sidelying ER Rt 500g ball 2x10 Modalities: Vasopneumatic (Game Ready)   Location:  right shoulder Time:  10 minutes Pressure:  low Temperature:  34 degrees    PATIENT EDUCATION: Education details: eval findings, FOTO, HEP, POC Person educated: Patient Education method: Explanation, Demonstration, and Handouts Education comprehension: verbalized understanding and returned demonstration   HOME EXERCISE PROGRAM: Access Code: [email or web address] URL: [email or web address] Date: 06/04/2022 Prepared by: Octavio Manns   Exercises - Supine Shoulder Flexion Extension AAROM with Dowel  - 2-3 x daily - 7 x weekly - 2 sets - 10 reps - Standing Shoulder Row with Anchored Resistance  - 2-3 x daily - 7 x weekly - 3 sets - 10 reps - blue band hold - Seated Shoulder Flexion Towel Slide at Table Top  - 2-3 x daily - 7 x weekly - 2 sets - 10 reps - 5 sec hold - Seated Shoulder Abduction Towel Slide at Table Top  - 2-3 x daily - 7 x weekly - 2 sets - 10 reps - 5 sec hold   ASSESSMENT:   CLINICAL IMPRESSION: Patient presents to PT reporting continued pain congruent with post op timeline. Session today continued to focus on  RTC and periscapular strengthening. Manual techniques utilized today to improve ROM into flexion/abduction. Patient was able to tolerate all prescribed exercises with no adverse effects. Patient continues to benefit from skilled PT services and should be progressed as able to improve functional independence.     OBJECTIVE IMPAIRMENTS: decreased activity tolerance, decreased mobility, decreased ROM, decreased strength, and pain.    ACTIVITY LIMITATIONS: carrying, lifting, bathing, dressing, and reach over head   PARTICIPATION LIMITATIONS: driving, shopping, community activity, occupation, and yard work   PERSONAL FACTORS: 1-2 comorbidities: HTN, PTSD, Cervical fusion, L RTC repair  are also affecting patient's functional outcome.    REHAB POTENTIAL: Excellent   CLINICAL DECISION MAKING: Stable/uncomplicated   EVALUATION COMPLEXITY: Low     GOALS: Goals reviewed with patient? No   SHORT TERM GOALS: Target date: 06/25/2022   Pt will be compliant and knowledgeable with initial HEP for improved comfort and carryover Baseline: initial HEP given  Goal status: MET Pt reports adherence 06/13/22   2.  Pt will self report right shoulder pain no greater than 6/10 for improved comfort and functional ability Baseline: 10/10 at worst Goal status: Ongoing   LONG TERM GOALS: Target date: 07/30/2022   Pt will improve FOTO function score to no less than 63% as proxy for functional improvement Baseline: 43% function Goal status: INITIAL    2.  Pt will self report right shoulder pain no greater than 2/10 for improved comfort and functional ability Baseline: 9/10 at worst Goal status: INITIAL    3.  Pt will improve R shoulder AROM into flex/abd to no less than 145 for improved functional ability with home ADLs and community activity Baseline: see chart Goal status: INITIAL   4.  Pt will be able to play drums not limited by R shoulder pain for improved comfort and getting back to desired  recreational activity Baseline: unable Goal status: INITIAL   PLAN:   PT FREQUENCY: 2x/week   PT DURATION: 8 weeks   PLANNED INTERVENTIONS: Therapeutic exercises, Therapeutic activity, Neuromuscular re-education, Balance training, Gait training, Patient/Family education, Self Care, Joint mobilization, Dry Needling, Electrical stimulation, Cryotherapy, Moist heat, Vasopneumatic device, Manual therapy, and Re-evaluation   PLAN FOR NEXT SESSION: assess HEP response, progress per protocol    Margarette Canada, PTA 06/18/2022, 9:56  AM

## 2022-06-22 NOTE — Therapy (Signed)
OUTPATIENT PHYSICAL THERAPY TREATMENT NOTE   Patient Name: Wayne Bartlett MRN: [id number] DOB:07-Mar-1966, 57 y.o., male Today's Date: 06/23/2022  [facility]  REFERRING PROVIDER: Blair Hailey, PA-C   END OF SESSION:   PT End of Session - 06/23/22 0745     Visit Number 5    Number of Visits 15    Date for PT Re-Evaluation 07/30/22    [facility] Type [facility]    Authorization Time Period 15 visits 05/12/22-09/09/22    Authorization - Number of Visits 15    PT Start Time 0745    PT Stop Time 0840    PT Time Calculation (min) 55 min    Activity Tolerance Patient tolerated treatment well    Behavior During Therapy WFL for tasks assessed/performed                Past Medical History:  Diagnosis Date   Hypertension    PTSD (post-traumatic stress disorder)    Past Surgical History:  Procedure Laterality Date   FOOT SURGERY     NECK SURGERY     SHOULDER SURGERY     There are no problems to display for this patient.   REFERRING DIAG: right shoulder pain    THERAPY DIAG:  Acute pain of right shoulder  Muscle weakness (generalized)  Localized edema  Rationale for Evaluation and Treatment Rehabilitation  PERTINENT HISTORY: HTN, PTSD, Cervical fusion, L RTC repair   PRECAUTIONS: None  SUBJECTIVE:                                                                                                                                                                                      SUBJECTIVE STATEMENT:  Pt presents to PT with reports of continued R shoulder pain. Has continued HEP compliance.    PAIN:  Are you having pain?  Yes: NPRS scale: 5/10 Worst: 9/10  Pain location: right shoulder  Pain description: tight, sore Aggravating factors: laying on R side Relieving factors: rest   OBJECTIVE: (objective measures completed at initial evaluation unless otherwise dated)   DIAGNOSTIC FINDINGS:  N/A   PATIENT SURVEYS:  FOTO: 43% function; 63% predicted    COGNITION: Overall cognitive status: Within functional limits for tasks assessed                                  SENSATION: WFL   POSTURE: Rounded shoulders, fwd head   UPPER EXTREMITY ROM:    AROM/PROM Right AROM Right PROM  Shoulder flexion 85 125  Shoulder extension      Shoulder abduction 70 90  Shoulder  adduction      Shoulder internal rotation   70  Shoulder external rotation   40  Elbow flexion      Elbow extension      Wrist flexion      Wrist extension      Wrist ulnar deviation      Wrist radial deviation      Wrist pronation      Wrist supination      (Blank rows = not tested)   UPPER EXTREMITY MMT:   MMT Right eval Left eval  Shoulder flexion      Shoulder extension      Shoulder abduction      Shoulder adduction      Shoulder internal rotation      Shoulder external rotation      Middle trapezius      Lower trapezius      Elbow flexion      Elbow extension      Wrist flexion      Wrist extension      Wrist ulnar deviation      Wrist radial deviation      Wrist pronation      Wrist supination      Grip strength (lbs)      (Blank rows = not tested)   SHOULDER SPECIAL TESTS: DNT   JOINT MOBILITY TESTING:  DNT   PALPATION:  Slight TTP to surgical site during PROM             TREATMENT: [facility] Adult PT Treatment:                                                DATE: 06/23/22 Therapeutic Exercise: UBE lvl 1.0 x 3 min while taking subjective Pulley shoulder flexion x 2 min Rows BlueTB 2x10 Shoulder extension GTB 2x10 R shoulder IR/ER isometric x 10 - 5" hold Supine flexion AAROM with physioball 2x10 Sidelying abduction R x 10 (small ROM, <60) Sidelying ER 2x10 R  Manual: PROM flexion/abduction Glenohumeral joint mobs AP/inf grades I-III  [facility] Adult PT Treatment:                                                DATE: 06/18/22 Therapeutic Exercise: Rows BlueTB 2x10 Shoulder extension GTB 2x10 Supine flexion AAROM with dowel  2x10 Supine chest press with dowel 3# 2x15 Sidelying abduction Rt 2x10 (small ROM, <60) Manual: PROM flexion/abduction Glenohumeral joint mobs AP/inf grades I-III Modalities: Vasopneumatic (Game Ready)   Location:  right shoulder Time:  10 minutes Pressure:  low Temperature:  34 degrees    PATIENT EDUCATION: Education details: eval findings, FOTO, HEP, POC Person educated: Patient Education method: Explanation, Demonstration, and Handouts Education comprehension: verbalized understanding and returned demonstration   HOME EXERCISE PROGRAM: Access Code: [email or web address] URL: [email or web address]  Date: 06/04/2022 Prepared by: Octavio Manns   Exercises - Supine Shoulder Flexion Extension AAROM with Dowel  - 2-3 x daily - 7 x weekly - 2 sets - 10 reps - Standing Shoulder Row with Anchored Resistance  - 2-3 x daily - 7 x weekly - 3 sets - 10 reps - blue band hold - Seated Shoulder Flexion Towel Slide at Table Top  - 2-3 x  daily - 7 x weekly - 2 sets - 10 reps - 5 sec hold - Seated Shoulder Abduction Towel Slide at Table Top  - 2-3 x daily - 7 x weekly - 2 sets - 10 reps - 5 sec hold   ASSESSMENT:   CLINICAL IMPRESSION: Pt was able to complete prescribed exercises, continues to have pain and discomfort with progressing ROM but otherwise no adverse effect. PT able to progress pt's PROM to R shoulder today. Pt continues to benefit from skilled PT services, will continue to progress as tolerated per POC.    OBJECTIVE IMPAIRMENTS: decreased activity tolerance, decreased mobility, decreased ROM, decreased strength, and pain.    ACTIVITY LIMITATIONS: carrying, lifting, bathing, dressing, and reach over head   PARTICIPATION LIMITATIONS: driving, shopping, community activity, occupation, and yard work   PERSONAL FACTORS: 1-2 comorbidities: HTN, PTSD, Cervical fusion, L RTC repair  are also affecting patient's functional outcome.      GOALS: Goals reviewed with patient? No    SHORT TERM GOALS: Target date: 06/25/2022   Pt will be compliant and knowledgeable with initial HEP for improved comfort and carryover Baseline: initial HEP given  Goal status: MET Pt reports adherence 06/13/22   2.  Pt will self report right shoulder pain no greater than 6/10 for improved comfort and functional ability Baseline: 10/10 at worst Goal status: Ongoing   LONG TERM GOALS: Target date: 07/30/2022   Pt will improve FOTO function score to no less than 63% as proxy for functional improvement Baseline: 43% function Goal status: INITIAL    2.  Pt will self report right shoulder pain no greater than 2/10 for improved comfort and functional ability Baseline: 9/10 at worst Goal status: INITIAL    3.  Pt will improve R shoulder AROM into flex/abd to no less than 145 for improved functional ability with home ADLs and community activity Baseline: see chart Goal status: INITIAL   4.  Pt will be able to play drums not limited by R shoulder pain for improved comfort and getting back to desired recreational activity Baseline: unable Goal status: INITIAL   PLAN:   PT FREQUENCY: 2x/week   PT DURATION: 8 weeks   PLANNED INTERVENTIONS: Therapeutic exercises, Therapeutic activity, Neuromuscular re-education, Balance training, Gait training, Patient/Family education, Self Care, Joint mobilization, Dry Needling, Electrical stimulation, Cryotherapy, Moist heat, Vasopneumatic device, Manual therapy, and Re-evaluation   PLAN FOR NEXT SESSION: assess HEP response, progress per protocol    Ward Chatters, PT 06/23/2022, 8:49 AM

## 2022-06-23 ENCOUNTER — Ambulatory Visit: Payer: No Typology Code available for payment source

## 2022-06-23 DIAGNOSIS — M25511 Pain in right shoulder: Secondary | ICD-10-CM

## 2022-06-23 DIAGNOSIS — M6281 Muscle weakness (generalized): Secondary | ICD-10-CM

## 2022-06-23 DIAGNOSIS — R600 Localized edema: Secondary | ICD-10-CM

## 2022-06-25 ENCOUNTER — Ambulatory Visit: Payer: No Typology Code available for payment source

## 2022-06-29 NOTE — Therapy (Signed)
OUTPATIENT PHYSICAL THERAPY TREATMENT NOTE   Patient Name: Wayne Bartlett MRN: [id number] DOB:[birth year], 57 y.o., male Today's Date: 06/30/2022  [facility]  REFERRING PROVIDER: Blair Hailey, PA-C   END OF SESSION:   PT End of Session - 06/30/22 0743     Visit Number 6    Number of Visits 15    Date for PT Re-Evaluation 07/30/22    [facility]    Authorization Time Period 15 visits 05/12/22-09/09/22    Authorization - Number of Visits 15    PT Start Time 0745    PT Stop Time 0825    PT Time Calculation (min) 40 min    Activity Tolerance Patient tolerated treatment well    Behavior During Therapy WFL for tasks assessed/performed                 Past Medical History:  Diagnosis Date   Hypertension    PTSD (post-traumatic stress disorder)    Past Surgical History:  Procedure Laterality Date   FOOT SURGERY     NECK SURGERY     SHOULDER SURGERY     There are no problems to display for this patient.   REFERRING DIAG: right shoulder pain    THERAPY DIAG:  Acute pain of right shoulder  Muscle weakness (generalized)  Localized edema  Rationale for Evaluation and Treatment Rehabilitation  PERTINENT HISTORY: HTN, PTSD, Cervical fusion, L RTC repair   PRECAUTIONS: None  SUBJECTIVE:                                                                                                                                                                                      SUBJECTIVE STATEMENT:  Pt presents to PT with reports of continued R shoulder pain, notes it is worse today. Has tried to be compliant with HEP.    PAIN:  Are you having pain?  Yes: NPRS scale: 7/10 Worst: 9/10  Pain location: right shoulder  Pain description: tight, sore Aggravating factors: laying on R side Relieving factors: rest   OBJECTIVE: (objective measures completed at initial evaluation unless otherwise dated)   DIAGNOSTIC FINDINGS:  N/A   PATIENT SURVEYS:  FOTO: 43%  function; 63% predicted   COGNITION: Overall cognitive status: Within functional limits for tasks assessed                                  SENSATION: WFL   POSTURE: Rounded shoulders, fwd head   UPPER EXTREMITY ROM:    AROM/PROM Right AROM Right PROM  Shoulder flexion 85 125  Shoulder extension  Shoulder abduction 70 90  Shoulder adduction      Shoulder internal rotation   70  Shoulder external rotation   40  Elbow flexion      Elbow extension      Wrist flexion      Wrist extension      Wrist ulnar deviation      Wrist radial deviation      Wrist pronation      Wrist supination      (Blank rows = not tested)   UPPER EXTREMITY MMT:   MMT Right eval Left eval  Shoulder flexion      Shoulder extension      Shoulder abduction      Shoulder adduction      Shoulder internal rotation      Shoulder external rotation      Middle trapezius      Lower trapezius      Elbow flexion      Elbow extension      Wrist flexion      Wrist extension      Wrist ulnar deviation      Wrist radial deviation      Wrist pronation      Wrist supination      Grip strength (lbs)      (Blank rows = not tested)   SHOULDER SPECIAL TESTS: DNT   JOINT MOBILITY TESTING:  DNT   PALPATION:  Slight TTP to surgical site during PROM             TREATMENT: [facility] Adult PT Treatment:                                                DATE: 06/30/22 Therapeutic Exercise: UBE lvl 1.0 x 3 min while taking subjective Pulley shoulder flexion x 2 min Rows Green TB 2x10 Shoulder extension GTB 2x10 R shoulder IR/ER x 10 RTB Standing physioball roll up incline x 10 R shoulder flexion Supine flexion AAROM with physioball 2x10 Sidelying abduction R x 10 (small ROM, <60) Sidelying ER 2x10 R  Manual: PROM flexion/abduction Glenohumeral joint mobs AP/inf grades I-III Modalities: Vasopneumatic (Game Ready)   Location:  right shoulder Time:  10 minutes Pressure:  low Temperature:  34  degrees  [facility] Adult PT Treatment:                                                DATE: 06/23/22 Therapeutic Exercise: UBE lvl 1.0 x 3 min while taking subjective Pulley shoulder flexion x 2 min Rows BlueTB 2x10 Shoulder extension GTB 2x10 R shoulder IR/ER isometric x 10 - 5" hold Supine flexion AAROM with physioball 2x10 Sidelying abduction R x 10 (small ROM, <60) Sidelying ER 2x10 R  Manual: PROM flexion/abduction Glenohumeral joint mobs AP/inf grades I-III  [facility] Adult PT Treatment:                                                DATE: 06/18/22 Therapeutic Exercise: Rows BlueTB 2x10 Shoulder extension GTB 2x10 Supine flexion AAROM with dowel 2x10 Supine chest press with  dowel 3# 2x15 Sidelying abduction Rt 2x10 (small ROM, <60) Manual: PROM flexion/abduction Glenohumeral joint mobs AP/inf grades I-III Modalities: Vasopneumatic (Game Ready)   Location:  right shoulder Time:  10 minutes Pressure:  low Temperature:  34 degrees    PATIENT EDUCATION: Education details: eval findings, FOTO, HEP, POC Person educated: Patient Education method: Explanation, Demonstration, and Handouts Education comprehension: verbalized understanding and returned demonstration   HOME EXERCISE PROGRAM: Access Code: [email or web address] URL: [email or web address] Date: 06/30/2022 Prepared by: Octavio Manns  Exercises - Supine Shoulder Flexion Extension AAROM with Dowel  - 2-3 x daily - 7 x weekly - 2 sets - 10 reps - Standing Shoulder Row with Anchored Resistance  - 2-3 x daily - 7 x weekly - 3 sets - 10 reps - blue band hold - Seated Shoulder Flexion Towel Slide at Table Top  - 2-3 x daily - 7 x weekly - 2 sets - 10 reps - 5 sec hold - Seated Shoulder Abduction Towel Slide at Table Top  - 2-3 x daily - 7 x weekly - 2 sets - 10 reps - 5 sec hold - Standing Isometric Shoulder Internal Rotation with Towel Roll at Doorway  - 1 x daily - 7 x weekly - 2 sets - 10 reps - 3 sec hold - Standing  Isometric Shoulder External Rotation with Doorway  - 1 x daily - 7 x weekly - 2 sets - 10 reps - 3 sec hold - Sidelying Shoulder External Rotation  - 1 x daily - 7 x weekly - 2 sets - 10 reps   ASSESSMENT:   CLINICAL IMPRESSION: Pt was able to complete prescribed exercises, continues to have pain and discomfort with PROM and progression of RTC strengthening. Therapy continued to work on improving AAROM and periscapular strength as well today. Pt continues to require skilled PT services, will continue to progress as tolerated per POC.     OBJECTIVE IMPAIRMENTS: decreased activity tolerance, decreased mobility, decreased ROM, decreased strength, and pain.    ACTIVITY LIMITATIONS: carrying, lifting, bathing, dressing, and reach over head   PARTICIPATION LIMITATIONS: driving, shopping, community activity, occupation, and yard work   PERSONAL FACTORS: 1-2 comorbidities: HTN, PTSD, Cervical fusion, L RTC repair  are also affecting patient's functional outcome.      GOALS: Goals reviewed with patient? No   SHORT TERM GOALS: Target date: 06/25/2022   Pt will be compliant and knowledgeable with initial HEP for improved comfort and carryover Baseline: initial HEP given  Goal status: MET Pt reports adherence 06/13/22   2.  Pt will self report right shoulder pain no greater than 6/10 for improved comfort and functional ability Baseline: 10/10 at worst Goal status: Ongoing   LONG TERM GOALS: Target date: 07/30/2022   Pt will improve FOTO function score to no less than 63% as proxy for functional improvement Baseline: 43% function Goal status: INITIAL    2.  Pt will self report right shoulder pain no greater than 2/10 for improved comfort and functional ability Baseline: 9/10 at worst Goal status: INITIAL    3.  Pt will improve R shoulder AROM into flex/abd to no less than 145 for improved functional ability with home ADLs and community activity Baseline: see chart Goal status: INITIAL    4.  Pt will be able to play drums not limited by R shoulder pain for improved comfort and getting back to desired recreational activity Baseline: unable Goal status: INITIAL   PLAN:   PT FREQUENCY: 2x/week   PT  DURATION: 8 weeks   PLANNED INTERVENTIONS: Therapeutic exercises, Therapeutic activity, Neuromuscular re-education, Balance training, Gait training, Patient/Family education, Self Care, Joint mobilization, Dry Needling, Electrical stimulation, Cryotherapy, Moist heat, Vasopneumatic device, Manual therapy, and Re-evaluation   PLAN FOR NEXT SESSION: assess HEP response, progress per protocol    Ward Chatters, PT 06/30/2022, 8:43 AM

## 2022-06-30 ENCOUNTER — Ambulatory Visit: Payer: No Typology Code available for payment source

## 2022-06-30 DIAGNOSIS — M25511 Pain in right shoulder: Secondary | ICD-10-CM

## 2022-06-30 DIAGNOSIS — R600 Localized edema: Secondary | ICD-10-CM

## 2022-06-30 DIAGNOSIS — M6281 Muscle weakness (generalized): Secondary | ICD-10-CM

## 2022-07-02 ENCOUNTER — Ambulatory Visit: Payer: No Typology Code available for payment source

## 2022-07-02 DIAGNOSIS — M25511 Pain in right shoulder: Secondary | ICD-10-CM | POA: Diagnosis not present

## 2022-07-02 DIAGNOSIS — M6281 Muscle weakness (generalized): Secondary | ICD-10-CM

## 2022-07-02 DIAGNOSIS — R600 Localized edema: Secondary | ICD-10-CM

## 2022-07-02 NOTE — Therapy (Signed)
OUTPATIENT PHYSICAL THERAPY TREATMENT NOTE   Patient Name: Wayne Bartlett MRN: [id number] DOB:1965/11/20, 57 y.o., male Today's Date: 07/02/2022  [facility]  REFERRING PROVIDER: Blair Hailey, PA-C   END OF SESSION:   PT End of Session - 07/02/22 0910     Visit Number 7    Number of Visits 15    Date for PT Re-Evaluation 07/30/22    [facility] Type [facility]    Authorization Time Period 15 visits 05/12/22-09/09/22    Authorization - Number of Visits 15    PT Start Time 0913    PT Stop Time 1007    PT Time Calculation (min) 54 min    Activity Tolerance Patient tolerated treatment well    Behavior During Therapy WFL for tasks assessed/performed                  Past Medical History:  Diagnosis Date   Hypertension    PTSD (post-traumatic stress disorder)    Past Surgical History:  Procedure Laterality Date   FOOT SURGERY     NECK SURGERY     SHOULDER SURGERY     There are no problems to display for this patient.   REFERRING DIAG: right shoulder pain    THERAPY DIAG:  Acute pain of right shoulder  Muscle weakness (generalized)  Localized edema  Rationale for Evaluation and Treatment Rehabilitation  PERTINENT HISTORY: HTN, PTSD, Cervical fusion, L RTC repair   PRECAUTIONS: None  SUBJECTIVE:                                                                                                                                                                                      SUBJECTIVE STATEMENT:  Pt presents to PT with continued reports of R shoulder pain and discomfort, although it is much less than previous session. Has continued HEP compliance.   PAIN:  Are you having pain?  Yes: NPRS scale: 4/10 Worst: 9/10  Pain location: right shoulder  Pain description: tight, sore Aggravating factors: laying on R side Relieving factors: rest   OBJECTIVE: (objective measures completed at initial evaluation unless otherwise dated)   DIAGNOSTIC FINDINGS:  N/A    PATIENT SURVEYS:  FOTO: 43% function; 63% predicted  55% function - 07/02/2022   COGNITION: Overall cognitive status: Within functional limits for tasks assessed                                  SENSATION: WFL   POSTURE: Rounded shoulders, fwd head   UPPER EXTREMITY ROM:    AROM/PROM Right AROM Right PROM Right AROM  Shoulder flexion 85 125 117  Shoulder extension       Shoulder abduction 70 90   Shoulder adduction       Shoulder internal rotation   70   Shoulder external rotation   40   Elbow flexion       Elbow extension       Wrist flexion       Wrist extension       Wrist ulnar deviation       Wrist radial deviation       Wrist pronation       Wrist supination       (Blank rows = not tested)   UPPER EXTREMITY MMT:   MMT Right eval Left eval  Shoulder flexion      Shoulder extension      Shoulder abduction      Shoulder adduction      Shoulder internal rotation      Shoulder external rotation      Middle trapezius      Lower trapezius      Elbow flexion      Elbow extension      Wrist flexion      Wrist extension      Wrist ulnar deviation      Wrist radial deviation      Wrist pronation      Wrist supination      Grip strength (lbs)      (Blank rows = not tested)   SHOULDER SPECIAL TESTS: DNT   JOINT MOBILITY TESTING:  DNT   PALPATION:  Slight TTP to surgical site during PROM             TREATMENT: [facility] Adult PT Treatment:                                                DATE: 07/02/22 Therapeutic Exercise: UBE lvl 1.5 x 4 min while taking subjective Pulley shoulder flexion x 2 min Standing row 3x10 13# Shoulder extension GTB 2x10 R shoulder IR/ER x 10 RTB R shoulder wall walk x 10 - flexion Supine flexion AAROM with physioball 2x10 Sidelying abduction R x 10 (small ROM, <60) Sidelying ER x 10 R  Manual: PROM flexion/abduction/ER/IR Modalities: Vasopneumatic (Game Ready)   Location:  right shoulder Time:  10  minutes Pressure:  low Temperature:  34 degrees  [facility] Adult PT Treatment:                                                DATE: 06/30/22 Therapeutic Exercise: UBE lvl 1.0 x 3 min while taking subjective Pulley shoulder flexion x 2 min Rows Green TB 2x10 Shoulder extension GTB 2x10 R shoulder IR/ER x 10 RTB Standing physioball roll up incline x 10 R shoulder flexion Supine flexion AAROM with physioball 2x10 Sidelying abduction R x 10 (small ROM, <60) Sidelying ER 2x10 R  Manual: PROM flexion/abduction Glenohumeral joint mobs AP/inf grades I-III Modalities: Vasopneumatic (Game Ready)   Location:  right shoulder Time:  10 minutes Pressure:  low Temperature:  34 degrees  [facility] Adult PT Treatment:  DATE: 06/23/22 Therapeutic Exercise: UBE lvl 1.0 x 3 min while taking subjective Pulley shoulder flexion x 2 min Rows BlueTB 2x10 Shoulder extension GTB 2x10 R shoulder IR/ER isometric x 10 - 5" hold Supine flexion AAROM with physioball 2x10 Sidelying abduction R x 10 (small ROM, <60) Sidelying ER 2x10 R  Manual: PROM flexion/abduction Glenohumeral joint mobs AP/inf grades I-III  [facility] Adult PT Treatment:                                                DATE: 06/18/22 Therapeutic Exercise: Rows BlueTB 2x10 Shoulder extension GTB 2x10 Supine flexion AAROM with dowel 2x10 Supine chest press with dowel 3# 2x15 Sidelying abduction Rt 2x10 (small ROM, <60) Manual: PROM flexion/abduction Glenohumeral joint mobs AP/inf grades I-III Modalities: Vasopneumatic (Game Ready)   Location:  right shoulder Time:  10 minutes Pressure:  low Temperature:  34 degrees    PATIENT EDUCATION: Education details: eval findings, FOTO, HEP, POC Person educated: Patient Education method: Explanation, Demonstration, and Handouts Education comprehension: verbalized understanding and returned demonstration   HOME EXERCISE PROGRAM: Access Code:  [email or web address] URL: [email or web address] Date: 06/30/2022 Prepared by: Octavio Manns  Exercises - Supine Shoulder Flexion Extension AAROM with Dowel  - 2-3 x daily - 7 x weekly - 2 sets - 10 reps - Standing Shoulder Row with Anchored Resistance  - 2-3 x daily - 7 x weekly - 3 sets - 10 reps - blue band hold - Seated Shoulder Flexion Towel Slide at Table Top  - 2-3 x daily - 7 x weekly - 2 sets - 10 reps - 5 sec hold - Seated Shoulder Abduction Towel Slide at Table Top  - 2-3 x daily - 7 x weekly - 2 sets - 10 reps - 5 sec hold - Standing Isometric Shoulder Internal Rotation with Towel Roll at Doorway  - 1 x daily - 7 x weekly - 2 sets - 10 reps - 3 sec hold - Standing Isometric Shoulder External Rotation with Doorway  - 1 x daily - 7 x weekly - 2 sets - 10 reps - 3 sec hold - Sidelying Shoulder External Rotation  - 1 x daily - 7 x weekly - 2 sets - 10 reps   ASSESSMENT:   CLINICAL IMPRESSION: Pt was able to complete prescribed exercises, was able to progress AROM today and has near full PROM with exception of ER for R shoulder. Therapy continued to work on improving R shoulder ROM and periscapular/RTC strength today post op in order to improve function. Pt continues to require skilled PT services, will continue to progress as tolerated per POC.      OBJECTIVE IMPAIRMENTS: decreased activity tolerance, decreased mobility, decreased ROM, decreased strength, and pain.    ACTIVITY LIMITATIONS: carrying, lifting, bathing, dressing, and reach over head   PARTICIPATION LIMITATIONS: driving, shopping, community activity, occupation, and yard work   PERSONAL FACTORS: 1-2 comorbidities: HTN, PTSD, Cervical fusion, L RTC repair  are also affecting patient's functional outcome.      GOALS: Goals reviewed with patient? No   SHORT TERM GOALS: Target date: 06/25/2022   Pt will be compliant and knowledgeable with initial HEP for improved comfort and carryover Baseline: initial HEP given   Goal status: MET Pt reports adherence 06/13/22   2.  Pt will self report right shoulder pain no greater than 6/10 for improved  comfort and functional ability Baseline: 10/10 at worst Goal status: ONGOING   LONG TERM GOALS: Target date: 07/30/2022   Pt will improve FOTO function score to no less than 63% as proxy for functional improvement Baseline: 43% function 07/02/2022: 55% function Goal status:ONGOING   2.  Pt will self report right shoulder pain no greater than 2/10 for improved comfort and functional ability Baseline: 9/10 at worst Goal status: ONGOING    3.  Pt will improve R shoulder AROM into flex/abd to no less than 145 for improved functional ability with home ADLs and community activity Baseline: see chart Goal status: ONGOING   4.  Pt will be able to play drums not limited by R shoulder pain for improved comfort and getting back to desired recreational activity Baseline: unable Goal status: ONGOING   PLAN:   PT FREQUENCY: 2x/week   PT DURATION: 8 weeks   PLANNED INTERVENTIONS: Therapeutic exercises, Therapeutic activity, Neuromuscular re-education, Balance training, Gait training, Patient/Family education, Self Care, Joint mobilization, Dry Needling, Electrical stimulation, Cryotherapy, Moist heat, Vasopneumatic device, Manual therapy, and Re-evaluation   PLAN FOR NEXT SESSION: assess HEP response, progress per protocol    Ward Chatters, PT 07/02/2022, 10:12 AM

## 2022-07-07 ENCOUNTER — Ambulatory Visit: Payer: No Typology Code available for payment source | Attending: Physician Assistant

## 2022-07-07 DIAGNOSIS — R600 Localized edema: Secondary | ICD-10-CM | POA: Diagnosis present

## 2022-07-07 DIAGNOSIS — M25511 Pain in right shoulder: Secondary | ICD-10-CM | POA: Insufficient documentation

## 2022-07-07 DIAGNOSIS — M6281 Muscle weakness (generalized): Secondary | ICD-10-CM | POA: Diagnosis present

## 2022-07-07 NOTE — Therapy (Signed)
OUTPATIENT PHYSICAL THERAPY TREATMENT NOTE   Patient Name: Wayne Bartlett MRN: [id number] DOB:May 03, 1966, 57 y.o., male Today's Date: 07/07/2022  [facility]  REFERRING PROVIDER: Blair Hailey, PA-C   END OF SESSION:   PT End of Session - 07/07/22 0826     Visit Number 8    Number of Visits 15    Date for PT Re-Evaluation 07/30/22    [facility]    Authorization Time Period 15 visits 05/12/22-09/09/22    Authorization - Number of Visits 15    PT Start Time 0830    PT Stop Time 0910    PT Time Calculation (min) 40 min    Activity Tolerance Patient tolerated treatment well    Behavior During Therapy WFL for tasks assessed/performed                   Past Medical History:  Diagnosis Date   Hypertension    PTSD (post-traumatic stress disorder)    Past Surgical History:  Procedure Laterality Date   FOOT SURGERY     NECK SURGERY     SHOULDER SURGERY     There are no problems to display for this patient.   REFERRING DIAG: right shoulder pain    THERAPY DIAG:  Acute pain of right shoulder  Muscle weakness (generalized)  Localized edema  Rationale for Evaluation and Treatment Rehabilitation  PERTINENT HISTORY: HTN, PTSD, Cervical fusion, L RTC repair   PRECAUTIONS: None  SUBJECTIVE:                                                                                                                                                                                      SUBJECTIVE STATEMENT:  Pt presents to PT with reports of R shoulder and recent onset of R forearm pain. Has been compliant with HEP.    PAIN:  Are you having pain?  Yes: NPRS scale: 4/10 Worst: 9/10  Pain location: right shoulder  Pain description: tight, sore Aggravating factors: laying on R side Relieving factors: rest   OBJECTIVE: (objective measures completed at initial evaluation unless otherwise dated)   DIAGNOSTIC FINDINGS:  N/A   PATIENT SURVEYS:  FOTO: 43% function;  63% predicted  55% function - 07/02/2022   COGNITION: Overall cognitive status: Within functional limits for tasks assessed                                  SENSATION: WFL   POSTURE: Rounded shoulders, fwd head   UPPER EXTREMITY ROM:    AROM/PROM Right AROM Right PROM Right AROM  Shoulder flexion  85 125 117  Shoulder extension       Shoulder abduction 70 90   Shoulder adduction       Shoulder internal rotation   70   Shoulder external rotation   40   Elbow flexion       Elbow extension       Wrist flexion       Wrist extension       Wrist ulnar deviation       Wrist radial deviation       Wrist pronation       Wrist supination       (Blank rows = not tested)   UPPER EXTREMITY MMT:   MMT Right eval Left eval  Shoulder flexion      Shoulder extension      Shoulder abduction      Shoulder adduction      Shoulder internal rotation      Shoulder external rotation      Middle trapezius      Lower trapezius      Elbow flexion      Elbow extension      Wrist flexion      Wrist extension      Wrist ulnar deviation      Wrist radial deviation      Wrist pronation      Wrist supination      Grip strength (lbs)      (Blank rows = not tested)   SHOULDER SPECIAL TESTS: DNT   JOINT MOBILITY TESTING:  DNT   PALPATION:  Slight TTP to surgical site during PROM             TREATMENT: [facility] Adult PT Treatment:                                                DATE: 07/02/22 Therapeutic Exercise: UBE lvl 1.5 x 4 min while taking subjective Pulley shoulder flexion x 2 min Standing row 3x10 13# Standing wall slide with towel AAROM x 10 - flexion Shoulder extension GTB 2x10 R shoulder IR/ER x 10 RTB Standing cane shoulder abd AAROM x 10 R Supine flexion AROM x 10 Manual: PROM flexion/abduction/ER/IR  [facility] Adult PT Treatment:                                                DATE: 06/30/22 Therapeutic Exercise: UBE lvl 1.0 x 3 min while taking subjective Pulley  shoulder flexion x 2 min Rows Green TB 2x10 Shoulder extension GTB 2x10 R shoulder IR/ER x 10 RTB Standing physioball roll up incline x 10 R shoulder flexion Supine flexion AAROM with physioball 2x10 Sidelying abduction R x 10 (small ROM, <60) Sidelying ER 2x10 R  Manual: PROM flexion/abduction Glenohumeral joint mobs AP/inf grades I-III Modalities: Vasopneumatic (Game Ready)   Location:  right shoulder Time:  10 minutes Pressure:  low Temperature:  34 degrees  [facility] Adult PT Treatment:                                                DATE:  06/23/22 Therapeutic Exercise: UBE lvl 1.0 x 3 min while taking subjective Pulley shoulder flexion x 2 min Rows BlueTB 2x10 Shoulder extension GTB 2x10 R shoulder IR/ER isometric x 10 - 5" hold Supine flexion AAROM with physioball 2x10 Sidelying abduction R x 10 (small ROM, <60) Sidelying ER 2x10 R  Manual: PROM flexion/abduction Glenohumeral joint mobs AP/inf grades I-III  [facility] Adult PT Treatment:                                                DATE: 06/18/22 Therapeutic Exercise: Rows BlueTB 2x10 Shoulder extension GTB 2x10 Supine flexion AAROM with dowel 2x10 Supine chest press with dowel 3# 2x15 Sidelying abduction Rt 2x10 (small ROM, <60) Manual: PROM flexion/abduction Glenohumeral joint mobs AP/inf grades I-III Modalities: Vasopneumatic (Game Ready)   Location:  right shoulder Time:  10 minutes Pressure:  low Temperature:  34 degrees    PATIENT EDUCATION: Education details: HEP update Person educated: Patient Education method: Explanation, Demonstration, and Handouts Education comprehension: verbalized understanding and returned demonstration   HOME EXERCISE PROGRAM: Access Code: [email or web address] URL: [email or web address] Date: 06/30/2022 Prepared by: Octavio Manns  Exercises - Supine Shoulder Flexion Extension AAROM with Dowel  - 2-3 x daily - 7 x weekly - 2 sets - 10 reps - Standing Shoulder Row with  Anchored Resistance  - 2-3 x daily - 7 x weekly - 3 sets - 10 reps - blue band hold - Seated Shoulder Flexion Towel Slide at Table Top  - 2-3 x daily - 7 x weekly - 2 sets - 10 reps - 5 sec hold - Seated Shoulder Abduction Towel Slide at Table Top  - 2-3 x daily - 7 x weekly - 2 sets - 10 reps - 5 sec hold - Standing Isometric Shoulder Internal Rotation with Towel Roll at Doorway  - 1 x daily - 7 x weekly - 2 sets - 10 reps - 3 sec hold - Standing Isometric Shoulder External Rotation with Doorway  - 1 x daily - 7 x weekly - 2 sets - 10 reps - 3 sec hold - Sidelying Shoulder External Rotation  - 1 x daily - 7 x weekly - 2 sets - 10 reps   ASSESSMENT:   CLINICAL IMPRESSION: Pt was able to complete prescribed exercises with no adverse effect. Therapy continued to work on improving R shoulder ROM and periscapular/RTC strength today post op in order to improve function. Pt continues to require skilled PT services, will continue to progress as tolerated per POC.      OBJECTIVE IMPAIRMENTS: decreased activity tolerance, decreased mobility, decreased ROM, decreased strength, and pain.    ACTIVITY LIMITATIONS: carrying, lifting, bathing, dressing, and reach over head   PARTICIPATION LIMITATIONS: driving, shopping, community activity, occupation, and yard work   PERSONAL FACTORS: 1-2 comorbidities: HTN, PTSD, Cervical fusion, L RTC repair  are also affecting patient's functional outcome.      GOALS: Goals reviewed with patient? No   SHORT TERM GOALS: Target date: 06/25/2022   Pt will be compliant and knowledgeable with initial HEP for improved comfort and carryover Baseline: initial HEP given  Goal status: MET Pt reports adherence 06/13/22   2.  Pt will self report right shoulder pain no greater than 6/10 for improved comfort and functional ability Baseline: 10/10 at worst Goal status: ONGOING   LONG TERM GOALS: Target date: 07/30/2022  Pt will improve FOTO function score to no less than 63%  as proxy for functional improvement Baseline: 43% function 07/02/2022: 55% function Goal status:ONGOING   2.  Pt will self report right shoulder pain no greater than 2/10 for improved comfort and functional ability Baseline: 9/10 at worst Goal status: ONGOING    3.  Pt will improve R shoulder AROM into flex/abd to no less than 145 for improved functional ability with home ADLs and community activity Baseline: see chart Goal status: ONGOING   4.  Pt will be able to play drums not limited by R shoulder pain for improved comfort and getting back to desired recreational activity Baseline: unable Goal status: ONGOING   PLAN:   PT FREQUENCY: 2x/week   PT DURATION: 8 weeks   PLANNED INTERVENTIONS: Therapeutic exercises, Therapeutic activity, Neuromuscular re-education, Balance training, Gait training, Patient/Family education, Self Care, Joint mobilization, Dry Needling, Electrical stimulation, Cryotherapy, Moist heat, Vasopneumatic device, Manual therapy, and Re-evaluation   PLAN FOR NEXT SESSION: assess HEP response, progress per protocol    Ward Chatters, PT 07/07/2022, 9:11 AM

## 2022-07-09 NOTE — Therapy (Signed)
OUTPATIENT PHYSICAL THERAPY TREATMENT NOTE   Patient Name: Longino Trefz MRN: [id number] DOB:[birth year], 57 y.o., male Today's Date: 07/07/2022  [facility]  REFERRING PROVIDER: Blair Hailey, PA-C   END OF SESSION:   PT End of Session - 07/07/22 0826     Visit Number 8    Number of Visits 15    Date for PT Re-Evaluation 07/30/22    [facility] Type [facility]    Authorization Time Period 15 visits 05/12/22-09/09/22    Authorization - Number of Visits 15    PT Start Time 0830    PT Stop Time 0910    PT Time Calculation (min) 40 min    Activity Tolerance Patient tolerated treatment well    Behavior During Therapy WFL for tasks assessed/performed                   Past Medical History:  Diagnosis Date   Hypertension    PTSD (post-traumatic stress disorder)    Past Surgical History:  Procedure Laterality Date   FOOT SURGERY     NECK SURGERY     SHOULDER SURGERY     There are no problems to display for this patient.   REFERRING DIAG: right shoulder pain    THERAPY DIAG:  Acute pain of right shoulder  Muscle weakness (generalized)  Localized edema  Rationale for Evaluation and Treatment Rehabilitation  PERTINENT HISTORY: HTN, PTSD, Cervical fusion, L RTC repair   PRECAUTIONS: None  SUBJECTIVE:                                                                                                                                                                                      SUBJECTIVE STATEMENT:  Persistent R shoulder pain, worse with sleep positions and forward flexion    PAIN:  Are you having pain?  Yes: NPRS scale: 5/10 Worst: 9/10  Pain location: right shoulder  Pain description: tight, sore Aggravating factors: laying on R side Relieving factors: rest   OBJECTIVE: (objective measures completed at initial evaluation unless otherwise dated)   DIAGNOSTIC FINDINGS:  N/A   PATIENT SURVEYS:  FOTO: 43% function; 63% predicted  55% function -  07/02/2022   COGNITION: Overall cognitive status: Within functional limits for tasks assessed                                  SENSATION: WFL   POSTURE: Rounded shoulders, fwd head   UPPER EXTREMITY ROM:    AROM/PROM Right AROM Right PROM Right AROM  Shoulder flexion 85 125 117  Shoulder extension  Shoulder abduction 70 90   Shoulder adduction       Shoulder internal rotation   70   Shoulder external rotation   40   Elbow flexion       Elbow extension       Wrist flexion       Wrist extension       Wrist ulnar deviation       Wrist radial deviation       Wrist pronation       Wrist supination       (Blank rows = not tested)   UPPER EXTREMITY MMT:   MMT Right eval Left eval  Shoulder flexion      Shoulder extension      Shoulder abduction      Shoulder adduction      Shoulder internal rotation      Shoulder external rotation      Middle trapezius      Lower trapezius      Elbow flexion      Elbow extension      Wrist flexion      Wrist extension      Wrist ulnar deviation      Wrist radial deviation      Wrist pronation      Wrist supination      Grip strength (lbs)      (Blank rows = not tested)   SHOULDER SPECIAL TESTS: DNT   JOINT MOBILITY TESTING:  DNT   PALPATION:  Slight TTP to surgical site during PROM             TREATMENT: [facility] Adult PT Treatment:                                                DATE: 07/10/22 Therapeutic Exercise: UBE lvl 1.0 x 3/3 min OH pulleys 2 min Scapular wall slides with foam roll 15x Scaption on wall with towel(limited to 5 due to pain and substitution Manual Therapy: S/L 4 way scapula 15x ea. R shoulder joint mobs posterior, inferior 5x10 Grade 2-3 Unable to hold R scaption position against light resistance  [facility] Adult PT Treatment:                                                DATE: 07/02/22 Therapeutic Exercise: UBE lvl 1.5 x 4 min while taking subjective Pulley shoulder flexion x 2  min Standing row 3x10 13# Standing wall slide with towel AAROM x 10 - flexion Shoulder extension GTB 2x10 R shoulder IR/ER x 10 RTB Standing cane shoulder abd AAROM x 10 R Supine flexion AROM x 10 Manual: PROM flexion/abduction/ER/IR  [facility] Adult PT Treatment:                                                DATE: 06/30/22 Therapeutic Exercise: UBE lvl 1.0 x 3 min while taking subjective Pulley shoulder flexion x 2 min Rows Green TB 2x10 Shoulder extension GTB 2x10 R shoulder IR/ER x 10 RTB Standing physioball roll up incline x 10 R shoulder flexion Supine flexion AAROM  with physioball 2x10 Sidelying abduction R x 10 (small ROM, <60) Sidelying ER 2x10 R  Manual: PROM flexion/abduction Glenohumeral joint mobs AP/inf grades I-III Modalities: Vasopneumatic (Game Ready)   Location:  right shoulder Time:  10 minutes Pressure:  low Temperature:  34 degrees  [facility] Adult PT Treatment:                                                DATE: 06/23/22 Therapeutic Exercise: UBE lvl 1.0 x 3 min while taking subjective Pulley shoulder flexion x 2 min Rows BlueTB 2x10 Shoulder extension GTB 2x10 R shoulder IR/ER isometric x 10 - 5" hold Supine flexion AAROM with physioball 2x10 Sidelying abduction R x 10 (small ROM, <60) Sidelying ER 2x10 R  Manual: PROM flexion/abduction Glenohumeral joint mobs AP/inf grades I-III  [facility] Adult PT Treatment:                                                DATE: 06/18/22 Therapeutic Exercise: Rows BlueTB 2x10 Shoulder extension GTB 2x10 Supine flexion AAROM with dowel 2x10 Supine chest press with dowel 3# 2x15 Sidelying abduction Rt 2x10 (small ROM, <60) Manual: PROM flexion/abduction Glenohumeral joint mobs AP/inf grades I-III Modalities: Vasopneumatic (Game Ready)   Location:  right shoulder Time:  10 minutes Pressure:  low Temperature:  34 degrees    PATIENT EDUCATION: Education details: HEP update Person educated: Patient Education  method: Explanation, Demonstration, and Handouts Education comprehension: verbalized understanding and returned demonstration   HOME EXERCISE PROGRAM: Access Code: [email or web address] URL: [email or web address] Date: 06/30/2022 Prepared by: Octavio Manns  Exercises - Supine Shoulder Flexion Extension AAROM with Dowel  - 2-3 x daily - 7 x weekly - 2 sets - 10 reps - Standing Shoulder Row with Anchored Resistance  - 2-3 x daily - 7 x weekly - 3 sets - 10 reps - blue band hold - Seated Shoulder Flexion Towel Slide at Table Top  - 2-3 x daily - 7 x weekly - 2 sets - 10 reps - 5 sec hold - Seated Shoulder Abduction Towel Slide at Table Top  - 2-3 x daily - 7 x weekly - 2 sets - 10 reps - 5 sec hold - Standing Isometric Shoulder Internal Rotation with Towel Roll at Doorway  - 1 x daily - 7 x weekly - 2 sets - 10 reps - 3 sec hold - Standing Isometric Shoulder External Rotation with Doorway  - 1 x daily - 7 x weekly - 2 sets - 10 reps - 3 sec hold - Sidelying Shoulder External Rotation  - 1 x daily - 7 x weekly - 2 sets - 10 reps   ASSESSMENT:   CLINICAL IMPRESSION:Treatment  focus today ws regaining motion and mobility. Continues with high pain levels as well as soft tissue irritation across anterior shoulder.  Periscapular weakness evident as well as poor shoulder mechanics evidenced by substitution patterns.  Isolated scaption movements painful and weak.   OBJECTIVE IMPAIRMENTS: decreased activity tolerance, decreased mobility, decreased ROM, decreased strength, and pain.    ACTIVITY LIMITATIONS: carrying, lifting, bathing, dressing, and reach over head   PARTICIPATION LIMITATIONS: driving, shopping, community activity, occupation, and yard work   PERSONAL FACTORS: 1-2 comorbidities: HTN, PTSD, Cervical fusion,  L RTC repair  are also affecting patient's functional outcome.      GOALS: Goals reviewed with patient? No   SHORT TERM GOALS: Target date: 06/25/2022   Pt will be compliant  and knowledgeable with initial HEP for improved comfort and carryover Baseline: initial HEP given  Goal status: MET Pt reports adherence 06/13/22   2.  Pt will self report right shoulder pain no greater than 6/10 for improved comfort and functional ability Baseline: 10/10 at worst Goal status: ONGOING   LONG TERM GOALS: Target date: 07/30/2022   Pt will improve FOTO function score to no less than 63% as proxy for functional improvement Baseline: 43% function 07/02/2022: 55% function Goal status:ONGOING   2.  Pt will self report right shoulder pain no greater than 2/10 for improved comfort and functional ability Baseline: 9/10 at worst Goal status: ONGOING    3.  Pt will improve R shoulder AROM into flex/abd to no less than 145 for improved functional ability with home ADLs and community activity Baseline: see chart Goal status: ONGOING   4.  Pt will be able to play drums not limited by R shoulder pain for improved comfort and getting back to desired recreational activity Baseline: unable Goal status: ONGOING   PLAN:   PT FREQUENCY: 2x/week   PT DURATION: 8 weeks   PLANNED INTERVENTIONS: Therapeutic exercises, Therapeutic activity, Neuromuscular re-education, Balance training, Gait training, Patient/Family education, Self Care, Joint mobilization, Dry Needling, Electrical stimulation, Cryotherapy, Moist heat, Vasopneumatic device, Manual therapy, and Re-evaluation   PLAN FOR NEXT SESSION: assess HEP response, progress per protocol    Ward Chatters, PT 07/07/2022, 9:11 AM

## 2022-07-10 ENCOUNTER — Ambulatory Visit: Payer: No Typology Code available for payment source

## 2022-07-10 DIAGNOSIS — M25511 Pain in right shoulder: Secondary | ICD-10-CM | POA: Diagnosis not present

## 2022-07-10 DIAGNOSIS — R600 Localized edema: Secondary | ICD-10-CM

## 2022-07-10 DIAGNOSIS — M6281 Muscle weakness (generalized): Secondary | ICD-10-CM

## 2022-07-16 ENCOUNTER — Ambulatory Visit: Payer: No Typology Code available for payment source

## 2022-07-16 DIAGNOSIS — M25511 Pain in right shoulder: Secondary | ICD-10-CM | POA: Diagnosis not present

## 2022-07-16 DIAGNOSIS — M6281 Muscle weakness (generalized): Secondary | ICD-10-CM

## 2022-07-16 NOTE — Therapy (Signed)
OUTPATIENT PHYSICAL THERAPY TREATMENT NOTE/10TH VISIT PROGRESS NOTE   Patient Name: Wayne Bartlett MRN: [id number] DOB:[birth year], 57 y.o., male Today's Date: 07/16/2022  [facility]  REFERRING PROVIDER: Blair Hailey, PA-C  Physical Therapy Progress Note   Dates of Reporting Period: 06/04/22-07/16/22  See Note below for Objective Data and Assessment of Progress/Goals.   END OF SESSION:   PT End of Session - 07/16/22 1406     Visit Number 10    Number of Visits 15    Date for PT Re-Evaluation 07/30/22    [facility]ization Type [facility]    Authorization Time Period 15 visits 05/12/22-09/09/22    PT Start Time 1405    PT Stop Time 1445    PT Time Calculation (min) 40 min    Activity Tolerance Patient tolerated treatment well    Behavior During Therapy WFL for tasks assessed/performed                    Past Medical History:  Diagnosis Date   Hypertension    PTSD (post-traumatic stress disorder)    Past Surgical History:  Procedure Laterality Date   FOOT SURGERY     NECK SURGERY     SHOULDER SURGERY     There are no problems to display for this patient.   REFERRING DIAG: right shoulder pain    THERAPY DIAG:  Acute pain of right shoulder  Muscle weakness (generalized)  Rationale for Evaluation and Treatment Rehabilitation  PERTINENT HISTORY: HTN, PTSD, Cervical fusion, L RTC repair   PRECAUTIONS: Arthroscopy R shoulder 04/22/22  SUBJECTIVE:                                                                                                                                                                                      SUBJECTIVE STATEMENT:  Pain levels 6/10 at worst, lying on R side and sleep positions painful.  Still unable to forward flx RUE due to pain and weakness   PAIN:  Are you having pain?  Yes: NPRS scale: 5/10 Worst: 9/10  Pain location: right shoulder  Pain description: tight, sore Aggravating factors: laying on R side Relieving factors:  rest   OBJECTIVE: (objective measures completed at initial evaluation unless otherwise dated)   DIAGNOSTIC FINDINGS:  N/A   PATIENT SURVEYS:  FOTO: 43% function; 63% predicted  55% function - 07/02/2022   COGNITION: Overall cognitive status: Within functional limits for tasks assessed                                  SENSATION: WFL   POSTURE: Rounded  shoulders, fwd head   UPPER EXTREMITY ROM:    AROM/PROM Right AROM Right PROM Right AROM AAROM R 07/16/22  Shoulder flexion 85 125 117 135  Shoulder extension        Shoulder abduction 70 90    Shoulder adduction        Shoulder internal rotation   70    Shoulder external rotation   40    Elbow flexion        Elbow extension        Wrist flexion        Wrist extension        Wrist ulnar deviation        Wrist radial deviation        Wrist pronation        Wrist supination        (Blank rows = not tested)   UPPER EXTREMITY MMT:   MMT Right eval Left eval  Shoulder flexion      Shoulder extension      Shoulder abduction      Shoulder adduction      Shoulder internal rotation      Shoulder external rotation      Middle trapezius      Lower trapezius      Elbow flexion      Elbow extension      Wrist flexion      Wrist extension      Wrist ulnar deviation      Wrist radial deviation      Wrist pronation      Wrist supination      Grip strength (lbs)      (Blank rows = not tested)   SHOULDER SPECIAL TESTS: DNT   JOINT MOBILITY TESTING:  DNT   PALPATION:  Slight TTP to surgical site during PROM             TREATMENT: [facility] Adult PT Treatment:                                                DATE: 07/16/22 Therapeutic Exercise: Nustep L2 6 min Supine cane flexion 10x Seated scaption 15x Manual Therapy: R shoulder mobs inferior, posterior 5x10 4 way scapula focus on depression and retraction 15x STM and skilled palpation techniques to identify TP's in R pec major/minor, teres  major/minor Self Care: 10th visit FOTO update  [facility] Adult PT Treatment:                                                DATE: 07/10/22 Therapeutic Exercise: UBE lvl 1.0 x 3/3 min OH pulleys 2 min Scapular wall slides with foam roll 15x Scaption on wall with towel(limited to 5 due to pain and substitution Manual Therapy: S/L 4 way scapula 15x ea. R shoulder joint mobs posterior, inferior 5x10 Grade 2-3 Unable to hold R scaption position against light resistance  [facility] Adult PT Treatment:                                                DATE: 07/02/22 Therapeutic  Exercise: UBE lvl 1.5 x 4 min while taking subjective Pulley shoulder flexion x 2 min Standing row 3x10 13# Standing wall slide with towel AAROM x 10 - flexion Shoulder extension GTB 2x10 R shoulder IR/ER x 10 RTB Standing cane shoulder abd AAROM x 10 R Supine flexion AROM x 10 Manual: PROM flexion/abduction/ER/IR  [facility] Adult PT Treatment:                                                DATE: 06/30/22 Therapeutic Exercise: UBE lvl 1.0 x 3 min while taking subjective Pulley shoulder flexion x 2 min Rows Green TB 2x10 Shoulder extension GTB 2x10 R shoulder IR/ER x 10 RTB Standing physioball roll up incline x 10 R shoulder flexion Supine flexion AAROM with physioball 2x10 Sidelying abduction R x 10 (small ROM, <60) Sidelying ER 2x10 R  Manual: PROM flexion/abduction Glenohumeral joint mobs AP/inf grades I-III Modalities: Vasopneumatic (Game Ready)   Location:  right shoulder Time:  10 minutes Pressure:  low Temperature:  34 degrees  [facility] Adult PT Treatment:                                                DATE: 06/23/22 Therapeutic Exercise: UBE lvl 1.0 x 3 min while taking subjective Pulley shoulder flexion x 2 min Rows BlueTB 2x10 Shoulder extension GTB 2x10 R shoulder IR/ER isometric x 10 - 5" hold Supine flexion AAROM with physioball 2x10 Sidelying abduction R x 10 (small ROM, <60) Sidelying ER 2x10 R   Manual: PROM flexion/abduction Glenohumeral joint mobs AP/inf grades I-III  [facility] Adult PT Treatment:                                                DATE: 06/18/22 Therapeutic Exercise: Rows BlueTB 2x10 Shoulder extension GTB 2x10 Supine flexion AAROM with dowel 2x10 Supine chest press with dowel 3# 2x15 Sidelying abduction Rt 2x10 (small ROM, <60) Manual: PROM flexion/abduction Glenohumeral joint mobs AP/inf grades I-III Modalities: Vasopneumatic (Game Ready)   Location:  right shoulder Time:  10 minutes Pressure:  low Temperature:  34 degrees    PATIENT EDUCATION: Education details: HEP update Person educated: Patient Education method: Explanation, Demonstration, and Handouts Education comprehension: verbalized understanding and returned demonstration   HOME EXERCISE PROGRAM: Access Code: [email or web address] URL: [email or web address] Date: 06/30/2022 Prepared by: Octavio Manns  Exercises - Supine Shoulder Flexion Extension AAROM with Dowel  - 2-3 x daily - 7 x weekly - 2 sets - 10 reps - Standing Shoulder Row with Anchored Resistance  - 2-3 x daily - 7 x weekly - 3 sets - 10 reps - blue band hold - Seated Shoulder Flexion Towel Slide at Table Top  - 2-3 x daily - 7 x weekly - 2 sets - 10 reps - 5 sec hold - Seated Shoulder Abduction Towel Slide at Table Top  - 2-3 x daily - 7 x weekly - 2 sets - 10 reps - 5 sec hold - Standing Isometric Shoulder Internal Rotation with Towel Roll at Doorway  - 1 x daily - 7 x weekly - 2 sets -  10 reps - 3 sec hold - Standing Isometric Shoulder External Rotation with Doorway  - 1 x daily - 7 x weekly - 2 sets - 10 reps - 3 sec hold - Sidelying Shoulder External Rotation  - 1 x daily - 7 x weekly - 2 sets - 10 reps   ASSESSMENT:   CLINICAL IMPRESSION: Manual therapy for STM, PROM and skilled palpation to identify Tp's which are limiting ROM and function. AAROM flexion improved, FOTO score declined slightly, still limited in isolated  scaption.  Discussed TPDN at next session and patient in agreement.   OBJECTIVE IMPAIRMENTS: decreased activity tolerance, decreased mobility, decreased ROM, decreased strength, and pain.    ACTIVITY LIMITATIONS: carrying, lifting, bathing, dressing, and reach over head   PARTICIPATION LIMITATIONS: driving, shopping, community activity, occupation, and yard work   PERSONAL FACTORS: 1-2 comorbidities: HTN, PTSD, Cervical fusion, L RTC repair  are also affecting patient's functional outcome.      GOALS: Goals reviewed with patient? No   SHORT TERM GOALS: Target date: 06/25/2022   Pt will be compliant and knowledgeable with initial HEP for improved comfort and carryover Baseline: initial HEP given  Goal status: MET Pt reports adherence 06/13/22   2.  Pt will self report right shoulder pain no greater than 6/10 for improved comfort and functional ability Baseline: 10/10 at worst; 07/16/22 6/10 Goal status: Met   LONG TERM GOALS: Target date: 07/30/2022   Pt will improve FOTO function score to no less than 63% as proxy for functional improvement Baseline: 43% function 07/02/2022: 55% function; 07/16/22 51% Goal status:ONGOING   2.  Pt will self report right shoulder pain no greater than 2/10 for improved comfort and functional ability Baseline: 9/10 at worst Goal status: ONGOING    3.  Pt will improve R shoulder AROM into flex/abd to no less than 145 for improved functional ability with home ADLs and community activity Baseline: see chart Goal status: ONGOING   4.  Pt will be able to play drums not limited by R shoulder pain for improved comfort and getting back to desired recreational activity Baseline: unable Goal status: ONGOING   PLAN:   PT FREQUENCY: 2x/week   PT DURATION: 8 weeks   PLANNED INTERVENTIONS: Therapeutic exercises, Therapeutic activity, Neuromuscular re-education, Balance training, Gait training, Patient/Family education, Self Care, Joint mobilization, Dry  Needling, Electrical stimulation, Cryotherapy, Moist heat, Vasopneumatic device, Manual therapy, and Re-evaluation   PLAN FOR NEXT SESSION: assess HEP response, progress per protocol    Lanice Shirts, PT 07/16/2022, 3:09 PM

## 2022-07-20 ENCOUNTER — Ambulatory Visit: Payer: No Typology Code available for payment source

## 2022-07-20 DIAGNOSIS — M25511 Pain in right shoulder: Secondary | ICD-10-CM

## 2022-07-20 DIAGNOSIS — M6281 Muscle weakness (generalized): Secondary | ICD-10-CM

## 2022-07-20 NOTE — Therapy (Signed)
OUTPATIENT PHYSICAL THERAPY TREATMENT NOTE/10TH VISIT PROGRESS NOTE   Patient Name: Wayne Bartlett MRN: [id number] DOB:1966/02/21, 57 y.o., male Today's Date: 07/20/2022  [facility]  REFERRING PROVIDER: Blair Hailey, PA-C   END OF SESSION:   PT End of Session - 07/20/22 0745     Visit Number 11    Number of Visits 15    Date for PT Re-Evaluation 07/30/22    [facility] Type [facility]    Authorization Time Period 15 visits 05/12/22-09/09/22    PT Start Time 0745    PT Stop Time 0830    PT Time Calculation (min) 45 min    Activity Tolerance Patient tolerated treatment well    Behavior During Therapy WFL for tasks assessed/performed                    Past Medical History:  Diagnosis Date   Hypertension    PTSD (post-traumatic stress disorder)    Past Surgical History:  Procedure Laterality Date   FOOT SURGERY     NECK SURGERY     SHOULDER SURGERY     There are no problems to display for this patient.   REFERRING DIAG: right shoulder pain    THERAPY DIAG:  Acute pain of right shoulder  Muscle weakness (generalized)  Rationale for Evaluation and Treatment Rehabilitation  PERTINENT HISTORY: HTN, PTSD, Cervical fusion, L RTC repair   PRECAUTIONS: Arthroscopy R shoulder 04/22/22  SUBJECTIVE:                                                                                                                                                                                      SUBJECTIVE STATEMENT:  Rainy weather has made him stiff and sore, mostly in his cervical region   PAIN:  Are you having pain?  Yes: NPRS scale: 5/10 Worst: 9/10  Pain location: right shoulder  Pain description: tight, sore Aggravating factors: laying on R side Relieving factors: rest   OBJECTIVE: (objective measures completed at initial evaluation unless otherwise dated)   DIAGNOSTIC FINDINGS:  N/A   PATIENT SURVEYS:  FOTO: 43% function; 63% predicted  55% function - 07/02/2022    COGNITION: Overall cognitive status: Within functional limits for tasks assessed                                  SENSATION: WFL   POSTURE: Rounded shoulders, fwd head   UPPER EXTREMITY ROM:    AROM/PROM Right AROM Right PROM Right AROM AAROM R 07/16/22  Shoulder flexion 85 125 117 135  Shoulder extension  Shoulder abduction 70 90    Shoulder adduction        Shoulder internal rotation   70    Shoulder external rotation   40    Elbow flexion        Elbow extension        Wrist flexion        Wrist extension        Wrist ulnar deviation        Wrist radial deviation        Wrist pronation        Wrist supination        (Blank rows = not tested)   UPPER EXTREMITY MMT:   MMT Right eval Left eval  Shoulder flexion      Shoulder extension      Shoulder abduction      Shoulder adduction      Shoulder internal rotation      Shoulder external rotation      Middle trapezius      Lower trapezius      Elbow flexion      Elbow extension      Wrist flexion      Wrist extension      Wrist ulnar deviation      Wrist radial deviation      Wrist pronation      Wrist supination      Grip strength (lbs)      (Blank rows = not tested)   SHOULDER SPECIAL TESTS: DNT   JOINT MOBILITY TESTING:  DNT   PALPATION:  Slight TTP to surgical site during PROM             TREATMENT: [facility] Adult PT Treatment:                                                DATE: 07/20/22 Therapeutic Exercise: Nustep L2 6 min PNF D1 F/E 5x Seated scaption 15x 4 way scapula 15x against manual resistance Manual Therapy: Trigger Point Dry Needling Treatment: Pre-treatment instruction: Patient instructed on dry needling rationale, procedures, and possible side effects including pain during treatment (achy,cramping feeling), bruising, drop of blood, lightheadedness, nausea, sweating. Patient Consent Given: Yes Education handout provided: Previously provided Muscles treated: R  pectoralis major, R teres major and minor  Needle size and number: .30x2mm x 2 Electrical stimulation performed: No Parameters: N/A Treatment response/outcome: Twitch response elicited, Palpable decrease in muscle tension, and increased AROM Post-treatment instructions: Patient instructed to expect possible mild to moderate muscle soreness later today and/or tomorrow. Patient instructed in methods to reduce muscle soreness and to continue prescribed HEP. If patient was dry needled over the lung field, patient was instructed on signs and symptoms of pneumothorax and, however unlikely, to see immediate medical attention should they occur. Patient was also educated on signs and symptoms of infection and to seek medical attention should they occur. Patient verbalized understanding of these instructions and education.   [facility] Adult PT Treatment:                                                DATE: 07/16/22 Therapeutic Exercise: Nustep L2 6 min Supine cane flexion 10x Seated scaption 15x Manual Therapy: R  shoulder mobs inferior, posterior 5x10 4 way scapula focus on depression and retraction 15x STM and skilled palpation techniques to identify TP's in R pec major/minor, teres major/minor Self Care: 10th visit FOTO update  [facility] Adult PT Treatment:                                                DATE: 07/10/22 Therapeutic Exercise: UBE lvl 1.0 x 3/3 min OH pulleys 2 min Scapular wall slides with foam roll 15x Scaption on wall with towel(limited to 5 due to pain and substitution Manual Therapy: S/L 4 way scapula 15x ea. R shoulder joint mobs posterior, inferior 5x10 Grade 2-3 Unable to hold R scaption position against light resistance     PATIENT EDUCATION: Education details: HEP update Person educated: Patient Education method: Explanation, Demonstration, and Handouts Education comprehension: verbalized understanding and returned demonstration   HOME EXERCISE PROGRAM: Access Code:  [email or web address] URL: [email or web address] Date: 06/30/2022 Prepared by: Octavio Manns  Exercises - Supine Shoulder Flexion Extension AAROM with Dowel  - 2-3 x daily - 7 x weekly - 2 sets - 10 reps - Standing Shoulder Row with Anchored Resistance  - 2-3 x daily - 7 x weekly - 3 sets - 10 reps - blue band hold - Seated Shoulder Flexion Towel Slide at Table Top  - 2-3 x daily - 7 x weekly - 2 sets - 10 reps - 5 sec hold - Seated Shoulder Abduction Towel Slide at Table Top  - 2-3 x daily - 7 x weekly - 2 sets - 10 reps - 5 sec hold - Standing Isometric Shoulder Internal Rotation with Towel Roll at Doorway  - 1 x daily - 7 x weekly - 2 sets - 10 reps - 3 sec hold - Standing Isometric Shoulder External Rotation with Doorway  - 1 x daily - 7 x weekly - 2 sets - 10 reps - 3 sec hold - Sidelying Shoulder External Rotation  - 1 x daily - 7 x weekly - 2 sets - 10 reps   ASSESSMENT:   CLINICAL IMPRESSION: Added TPDN to R RC musculature as noted resulting in decreased pain and increased AROM.  Decreased resistance on Nustep following DN, reviewed scaption with patient able to demo 85d ROM.  Introduce PNF patterns for mobility.    OBJECTIVE IMPAIRMENTS: decreased activity tolerance, decreased mobility, decreased ROM, decreased strength, and pain.    ACTIVITY LIMITATIONS: carrying, lifting, bathing, dressing, and reach over head   PARTICIPATION LIMITATIONS: driving, shopping, community activity, occupation, and yard work   PERSONAL FACTORS: 1-2 comorbidities: HTN, PTSD, Cervical fusion, L RTC repair  are also affecting patient's functional outcome.      GOALS: Goals reviewed with patient? No   SHORT TERM GOALS: Target date: 06/25/2022   Pt will be compliant and knowledgeable with initial HEP for improved comfort and carryover Baseline: initial HEP given  Goal status: MET Pt reports adherence 06/13/22   2.  Pt will self report right shoulder pain no greater than 6/10 for improved comfort  and functional ability Baseline: 10/10 at worst; 07/16/22 6/10 Goal status: Met   LONG TERM GOALS: Target date: 07/30/2022   Pt will improve FOTO function score to no less than 63% as proxy for functional improvement Baseline: 43% function 07/02/2022: 55% function; 07/16/22 51% Goal status:ONGOING   2.  Pt will self report right  shoulder pain no greater than 2/10 for improved comfort and functional ability Baseline: 9/10 at worst Goal status: ONGOING    3.  Pt will improve R shoulder AROM into flex/abd to no less than 145 for improved functional ability with home ADLs and community activity Baseline: see chart Goal status: ONGOING   4.  Pt will be able to play drums not limited by R shoulder pain for improved comfort and getting back to desired recreational activity Baseline: unable Goal status: ONGOING   PLAN:   PT FREQUENCY: 2x/week   PT DURATION: 8 weeks   PLANNED INTERVENTIONS: Therapeutic exercises, Therapeutic activity, Neuromuscular re-education, Balance training, Gait training, Patient/Family education, Self Care, Joint mobilization, Dry Needling, Electrical stimulation, Cryotherapy, Moist heat, Vasopneumatic device, Manual therapy, and Re-evaluation   PLAN FOR NEXT SESSION: assess HEP response, progress per protocol    Lanice Shirts, PT 07/20/2022, 8:31 AM

## 2022-07-22 ENCOUNTER — Ambulatory Visit: Payer: No Typology Code available for payment source

## 2022-07-22 DIAGNOSIS — R600 Localized edema: Secondary | ICD-10-CM

## 2022-07-22 DIAGNOSIS — M25511 Pain in right shoulder: Secondary | ICD-10-CM

## 2022-07-22 DIAGNOSIS — M6281 Muscle weakness (generalized): Secondary | ICD-10-CM

## 2022-07-22 NOTE — Therapy (Signed)
OUTPATIENT PHYSICAL THERAPY TREATMENT NOTE   Patient Name: Wayne Bartlett MRN: [id number] DOB:[birth year], 57 y.o., male Today's Date: 07/22/2022  [facility]  REFERRING PROVIDER: Blair Hailey, PA-C   END OF SESSION:   PT End of Session - 07/22/22 0902     Visit Number 12    Number of Visits 15    Date for PT Re-Evaluation 07/30/22    [facility]    Authorization Time Period 15 visits 05/12/22-09/09/22    PT Start Time 0913    PT Stop Time 0954    PT Time Calculation (min) 41 min    Activity Tolerance Patient tolerated treatment well    Behavior During Therapy [facility] for tasks assessed/performed                     Past Medical History:  Diagnosis Date   Hypertension    PTSD (post-traumatic stress disorder)    Past Surgical History:  Procedure Laterality Date   FOOT SURGERY     NECK SURGERY     SHOULDER SURGERY     There are no problems to display for this patient.   REFERRING DIAG: right shoulder pain    THERAPY DIAG:  Acute pain of right shoulder  Muscle weakness (generalized)  Localized edema  Rationale for Evaluation and Treatment Rehabilitation  PERTINENT HISTORY: HTN, PTSD, Cervical fusion, L RTC repair   PRECAUTIONS: Arthroscopy R shoulder 04/22/22  SUBJECTIVE:                                                                                                                                                                                      SUBJECTIVE STATEMENT:  Pt presents to PT with reports of R shoulder pain and discomfort. Also promotes posterior L LE pain and discomfort, his MD wants him to get some exercises for piriformis syndrome.    PAIN:  Are you having pain?  Yes: NPRS scale: 5/10 Worst: 9/10  Pain location: right shoulder  Pain description: tight, sore Aggravating factors: laying on R side Relieving factors: rest   OBJECTIVE: (objective measures completed at initial evaluation unless otherwise  dated)   DIAGNOSTIC FINDINGS:  N/A   PATIENT SURVEYS:  FOTO: 43% function; 63% predicted  55% function - 07/02/2022   COGNITION: Overall cognitive status: Within functional limits for tasks assessed                                  SENSATION: WFL   POSTURE: Rounded shoulders, fwd head   UPPER EXTREMITY ROM:    AROM/PROM Right AROM Right  PROM Right AROM AAROM R 07/16/22  Shoulder flexion 85 125 117 135  Shoulder extension        Shoulder abduction 70 90    Shoulder adduction        Shoulder internal rotation   70    Shoulder external rotation   40    Elbow flexion        Elbow extension        Wrist flexion        Wrist extension        Wrist ulnar deviation        Wrist radial deviation        Wrist pronation        Wrist supination        (Blank rows = not tested)   UPPER EXTREMITY MMT:   MMT Right eval Left eval  Shoulder flexion      Shoulder extension      Shoulder abduction      Shoulder adduction      Shoulder internal rotation      Shoulder external rotation      Middle trapezius      Lower trapezius      Elbow flexion      Elbow extension      Wrist flexion      Wrist extension      Wrist ulnar deviation      Wrist radial deviation      Wrist pronation      Wrist supination      Grip strength (lbs)      (Blank rows = not tested)   SHOULDER SPECIAL TESTS: DNT   JOINT MOBILITY TESTING:  DNT   PALPATION:  Slight TTP to surgical site during PROM             TREATMENT: [facility] Adult PT Treatment:                                                DATE: 07/22/22 Therapeutic Exercise: UBE lvl 1.5 x 4 min while taking subjective Row 2x10 blue band R shoulder IR/ER 2x10 red band Seated cane ER AAROM x 10 - 5" hold R Seated bilateral ER 2x10 red band Supine cane flex AAROM x 15  Supine horizontal abd 2x10 RTB Seated Fig 4 x 30" - added per MD request S/L clamshell L x 10 - added per MD request Manual Therapy: Release to R pec  minor PROM to R shoulder ER  [facility] Adult PT Treatment:                                                DATE: 07/20/22 Therapeutic Exercise: Nustep L2 6 min PNF D1 F/E 5x Seated scaption 15x 4 way scapula 15x against manual resistance Manual Therapy: Trigger Point Dry Needling Treatment: Pre-treatment instruction: Patient instructed on dry needling rationale, procedures, and possible side effects including pain during treatment (achy,cramping feeling), bruising, drop of blood, lightheadedness, nausea, sweating. Patient Consent Given: Yes Education handout provided: Previously provided Muscles treated: R pectoralis major, R teres major and minor  Needle size and number: .30x62mm x 2 Electrical stimulation performed: No Parameters: N/A Treatment response/outcome: Twitch response elicited, Palpable decrease in muscle tension,  and increased AROM Post-treatment instructions: Patient instructed to expect possible mild to moderate muscle soreness later today and/or tomorrow. Patient instructed in methods to reduce muscle soreness and to continue prescribed HEP. If patient was dry needled over the lung field, patient was instructed on signs and symptoms of pneumothorax and, however unlikely, to see immediate medical attention should they occur. Patient was also educated on signs and symptoms of infection and to seek medical attention should they occur. Patient verbalized understanding of these instructions and education.   [facility] Adult PT Treatment:                                                DATE: 07/16/22 Therapeutic Exercise: Nustep L2 6 min Supine cane flexion 10x Seated scaption 15x Manual Therapy: R shoulder mobs inferior, posterior 5x10 4 way scapula focus on depression and retraction 15x STM and skilled palpation techniques to identify TP's in R pec major/minor, teres major/minor Self Care: 10th visit FOTO update  PATIENT EDUCATION: Education details: HEP update Person educated:  Patient Education method: Explanation, Demonstration, and Handouts Education comprehension: verbalized understanding and returned demonstration   HOME EXERCISE PROGRAM: Access Code: [email or web address] URL: [email or web address] Date: 06/30/2022 Prepared by: Octavio Manns  Exercises - Supine Shoulder Flexion Extension AAROM with Dowel  - 2-3 x daily - 7 x weekly - 2 sets - 10 reps - Standing Shoulder Row with Anchored Resistance  - 2-3 x daily - 7 x weekly - 3 sets - 10 reps - blue band hold - Seated Shoulder Flexion Towel Slide at Table Top  - 2-3 x daily - 7 x weekly - 2 sets - 10 reps - 5 sec hold - Seated Shoulder Abduction Towel Slide at Table Top  - 2-3 x daily - 7 x weekly - 2 sets - 10 reps - 5 sec hold - Standing Isometric Shoulder Internal Rotation with Towel Roll at Doorway  - 1 x daily - 7 x weekly - 2 sets - 10 reps - 3 sec hold - Standing Isometric Shoulder External Rotation with Doorway  - 1 x daily - 7 x weekly - 2 sets - 10 reps - 3 sec hold - Sidelying Shoulder External Rotation  - 1 x daily - 7 x weekly - 2 sets - 10 reps   ASSESSMENT:   CLINICAL IMPRESSION: Pt was able to complete prescribed exercises with no adverse effect/  Therapy continued to work on improving R shoulder ROM and periscapular/RTC strength today post op in order to improve function. Pt continues to require skilled PT services, will continue to progress as tolerated per POC.      OBJECTIVE IMPAIRMENTS: decreased activity tolerance, decreased mobility, decreased ROM, decreased strength, and pain.    ACTIVITY LIMITATIONS: carrying, lifting, bathing, dressing, and reach over head   PARTICIPATION LIMITATIONS: driving, shopping, community activity, occupation, and yard work   PERSONAL FACTORS: 1-2 comorbidities: HTN, PTSD, Cervical fusion, L RTC repair  are also affecting patient's functional outcome.      GOALS: Goals reviewed with patient? No   SHORT TERM GOALS: Target date: 06/25/2022   Pt  will be compliant and knowledgeable with initial HEP for improved comfort and carryover Baseline: initial HEP given  Goal status: MET Pt reports adherence 06/13/22   2.  Pt will self report right shoulder pain no greater than 6/10 for improved comfort and  functional ability Baseline: 10/10 at worst; 07/16/22 6/10 Goal status: Met   LONG TERM GOALS: Target date: 07/30/2022   Pt will improve FOTO function score to no less than 63% as proxy for functional improvement Baseline: 43% function 07/02/2022: 55% function; 07/16/22 51% Goal status:ONGOING   2.  Pt will self report right shoulder pain no greater than 2/10 for improved comfort and functional ability Baseline: 9/10 at worst Goal status: ONGOING    3.  Pt will improve R shoulder AROM into flex/abd to no less than 145 for improved functional ability with home ADLs and community activity Baseline: see chart Goal status: ONGOING   4.  Pt will be able to play drums not limited by R shoulder pain for improved comfort and getting back to desired recreational activity Baseline: unable Goal status: ONGOING   PLAN:   PT FREQUENCY: 2x/week   PT DURATION: 8 weeks   PLANNED INTERVENTIONS: Therapeutic exercises, Therapeutic activity, Neuromuscular re-education, Balance training, Gait training, Patient/Family education, Self Care, Joint mobilization, Dry Needling, Electrical stimulation, Cryotherapy, Moist heat, Vasopneumatic device, Manual therapy, and Re-evaluation   PLAN FOR NEXT SESSION: assess HEP response, progress per protocol    Ward Chatters, PT 07/22/2022, 9:57 AM

## 2022-07-27 ENCOUNTER — Ambulatory Visit: Payer: No Typology Code available for payment source

## 2022-07-27 DIAGNOSIS — M6281 Muscle weakness (generalized): Secondary | ICD-10-CM

## 2022-07-27 DIAGNOSIS — R600 Localized edema: Secondary | ICD-10-CM

## 2022-07-27 DIAGNOSIS — M25511 Pain in right shoulder: Secondary | ICD-10-CM | POA: Diagnosis not present

## 2022-07-27 NOTE — Therapy (Signed)
OUTPATIENT PHYSICAL THERAPY TREATMENT NOTE   Patient Name: Wayne Bartlett MRN: [id number] DOB:1966/01/14, 57 y.o., male Today's Date: 07/27/2022  [facility]  REFERRING PROVIDER: Blair Hailey, PA-C   END OF SESSION:   PT End of Session - 07/27/22 0826     Visit Number 13    Number of Visits 15    Date for PT Re-Evaluation 07/30/22    [facility] Type [facility]    Authorization Time Period 15 visits 05/12/22-09/09/22    PT Start Time 0830    PT Stop Time 0909    PT Time Calculation (min) 39 min    Activity Tolerance Patient tolerated treatment well    Behavior During Therapy WFL for tasks assessed/performed                      Past Medical History:  Diagnosis Date   Hypertension    PTSD (post-traumatic stress disorder)    Past Surgical History:  Procedure Laterality Date   FOOT SURGERY     NECK SURGERY     SHOULDER SURGERY     There are no problems to display for this patient.   REFERRING DIAG: right shoulder pain    THERAPY DIAG:  Acute pain of right shoulder  Muscle weakness (generalized)  Localized edema  Rationale for Evaluation and Treatment Rehabilitation  PERTINENT HISTORY: HTN, PTSD, Cervical fusion, L RTC repair   PRECAUTIONS: Arthroscopy R shoulder 04/22/22  SUBJECTIVE:                                                                                                                                                                                      SUBJECTIVE STATEMENT:  Pt presents to PT with reports of continued R shoulder pain. Has been compliant with HEP.    PAIN:  Are you having pain?  Yes: NPRS scale: 5/10 Worst: 9/10  Pain location: right shoulder  Pain description: tight, sore Aggravating factors: laying on R side Relieving factors: rest   OBJECTIVE: (objective measures completed at initial evaluation unless otherwise dated)   DIAGNOSTIC FINDINGS:  N/A   PATIENT SURVEYS:  FOTO: 43% function; 63% predicted  55%  function - 07/02/2022   COGNITION: Overall cognitive status: Within functional limits for tasks assessed                                  SENSATION: WFL   POSTURE: Rounded shoulders, fwd head   UPPER EXTREMITY ROM:    AROM/PROM Right AROM Right PROM Right AROM AAROM R 07/16/22  Shoulder flexion 85 125 117 135  Shoulder extension        Shoulder abduction 70 90    Shoulder adduction        Shoulder internal rotation   70    Shoulder external rotation   40    Elbow flexion        Elbow extension        Wrist flexion        Wrist extension        Wrist ulnar deviation        Wrist radial deviation        Wrist pronation        Wrist supination        (Blank rows = not tested)   UPPER EXTREMITY MMT:   MMT Right eval Left eval  Shoulder flexion      Shoulder extension      Shoulder abduction      Shoulder adduction      Shoulder internal rotation      Shoulder external rotation      Middle trapezius      Lower trapezius      Elbow flexion      Elbow extension      Wrist flexion      Wrist extension      Wrist ulnar deviation      Wrist radial deviation      Wrist pronation      Wrist supination      Grip strength (lbs)      (Blank rows = not tested)   SHOULDER SPECIAL TESTS: DNT   JOINT MOBILITY TESTING:  DNT   PALPATION:  Slight TTP to surgical site during PROM             TREATMENT: [facility] Adult PT Treatment:                                                DATE: 07/27/22 Therapeutic Exercise: UBE lvl 1.5 x 4 min while taking subjective Pulley x 2 min into R shoulder flex Row 2x10 blue band R shoulder IR/ER 2x10 red band UE ranger 43" x 15 Seated cane ER AAROM x 15 - 5" hold R Seated bilateral ER 2x10 red band Supine cane flex AAROM x 15  Supine horizontal abd 2x10 RTB Manual Therapy: Release to R pec minor PROM to R shoulder ER/flexion  [facility] Adult PT Treatment:                                                DATE: 07/22/22 Therapeutic  Exercise: UBE lvl 1.5 x 4 min while taking subjective Row 2x10 blue band R shoulder IR/ER 2x10 red band Seated cane ER AAROM x 10 - 5" hold R Seated bilateral ER 2x10 red band Supine cane flex AAROM x 15  Supine horizontal abd 2x10 RTB Seated Fig 4 x 30" - added per MD request S/L clamshell L x 10 - added per MD request Manual Therapy: Release to R pec minor PROM to R shoulder ER  [facility] Adult PT Treatment:  DATE: 07/20/22 Therapeutic Exercise: Nustep L2 6 min PNF D1 F/E 5x Seated scaption 15x 4 way scapula 15x against manual resistance Manual Therapy: Trigger Point Dry Needling Treatment: Pre-treatment instruction: Patient instructed on dry needling rationale, procedures, and possible side effects including pain during treatment (achy,cramping feeling), bruising, drop of blood, lightheadedness, nausea, sweating. Patient Consent Given: Yes Education handout provided: Previously provided Muscles treated: R pectoralis major, R teres major and minor  Needle size and number: .30x68mm x 2 Electrical stimulation performed: No Parameters: N/A Treatment response/outcome: Twitch response elicited, Palpable decrease in muscle tension, and increased AROM Post-treatment instructions: Patient instructed to expect possible mild to moderate muscle soreness later today and/or tomorrow. Patient instructed in methods to reduce muscle soreness and to continue prescribed HEP. If patient was dry needled over the lung field, patient was instructed on signs and symptoms of pneumothorax and, however unlikely, to see immediate medical attention should they occur. Patient was also educated on signs and symptoms of infection and to seek medical attention should they occur. Patient verbalized understanding of these instructions and education.   [facility] Adult PT Treatment:                                                DATE: 07/16/22 Therapeutic Exercise: Nustep L2 6  min Supine cane flexion 10x Seated scaption 15x Manual Therapy: R shoulder mobs inferior, posterior 5x10 4 way scapula focus on depression and retraction 15x STM and skilled palpation techniques to identify TP's in R pec major/minor, teres major/minor Self Care: 10th visit FOTO update  PATIENT EDUCATION: Education details: HEP update Person educated: Patient Education method: Explanation, Demonstration, and Handouts Education comprehension: verbalized understanding and returned demonstration   HOME EXERCISE PROGRAM: Access Code: [email or web address] URL: [email or web address] Date: 06/30/2022 Prepared by: Octavio Manns  Exercises - Supine Shoulder Flexion Extension AAROM with Dowel  - 2-3 x daily - 7 x weekly - 2 sets - 10 reps - Standing Shoulder Row with Anchored Resistance  - 2-3 x daily - 7 x weekly - 3 sets - 10 reps - blue band hold - Seated Shoulder Flexion Towel Slide at Table Top  - 2-3 x daily - 7 x weekly - 2 sets - 10 reps - 5 sec hold - Seated Shoulder Abduction Towel Slide at Table Top  - 2-3 x daily - 7 x weekly - 2 sets - 10 reps - 5 sec hold - Standing Isometric Shoulder Internal Rotation with Towel Roll at Doorway  - 1 x daily - 7 x weekly - 2 sets - 10 reps - 3 sec hold - Standing Isometric Shoulder External Rotation with Doorway  - 1 x daily - 7 x weekly - 2 sets - 10 reps - 3 sec hold - Sidelying Shoulder External Rotation  - 1 x daily - 7 x weekly - 2 sets - 10 reps   ASSESSMENT:   CLINICAL IMPRESSION: Pt was able to complete prescribed exercises with no adverse effect. Therapy continued to work on improving R shoulder ROM and periscapular/RTC strength today post op in order to improve function. Pt continues to require skilled PT services, will continue to progress as tolerated per POC.     OBJECTIVE IMPAIRMENTS: decreased activity tolerance, decreased mobility, decreased ROM, decreased strength, and pain.    ACTIVITY LIMITATIONS: carrying, lifting, bathing,  dressing, and reach over head  PARTICIPATION LIMITATIONS: driving, shopping, community activity, occupation, and yard work   PERSONAL FACTORS: 1-2 comorbidities: HTN, PTSD, Cervical fusion, L RTC repair  are also affecting patient's functional outcome.      GOALS: Goals reviewed with patient? No   SHORT TERM GOALS: Target date: 06/25/2022   Pt will be compliant and knowledgeable with initial HEP for improved comfort and carryover Baseline: initial HEP given  Goal status: MET Pt reports adherence 06/13/22   2.  Pt will self report right shoulder pain no greater than 6/10 for improved comfort and functional ability Baseline: 10/10 at worst; 07/16/22 6/10 Goal status: Met   LONG TERM GOALS: Target date: 07/30/2022   Pt will improve FOTO function score to no less than 63% as proxy for functional improvement Baseline: 43% function 07/02/2022: 55% function; 07/16/22 51% Goal status:ONGOING   2.  Pt will self report right shoulder pain no greater than 2/10 for improved comfort and functional ability Baseline: 9/10 at worst Goal status: ONGOING    3.  Pt will improve R shoulder AROM into flex/abd to no less than 145 for improved functional ability with home ADLs and community activity Baseline: see chart Goal status: ONGOING   4.  Pt will be able to play drums not limited by R shoulder pain for improved comfort and getting back to desired recreational activity Baseline: unable Goal status: ONGOING   PLAN:   PT FREQUENCY: 2x/week   PT DURATION: 8 weeks   PLANNED INTERVENTIONS: Therapeutic exercises, Therapeutic activity, Neuromuscular re-education, Balance training, Gait training, Patient/Family education, Self Care, Joint mobilization, Dry Needling, Electrical stimulation, Cryotherapy, Moist heat, Vasopneumatic device, Manual therapy, and Re-evaluation   PLAN FOR NEXT SESSION: assess HEP response, progress per protocol    Ward Chatters, PT 07/27/2022, 9:09 AM

## 2022-07-28 NOTE — Therapy (Signed)
OUTPATIENT PHYSICAL THERAPY TREATMENT NOTE   Patient Name: Wayne Bartlett MRN: [id number] DOB:1965-12-03, 57 y.o., male Today's Date: 07/29/2022  [facility]  REFERRING PROVIDER: Blair Hailey, PA-C   END OF SESSION:   PT End of Session - 07/29/22 0824     Visit Number 14    Number of Visits 30    Date for PT Re-Evaluation 09/23/22    [facility] Type [facility]    Authorization Time Period --    PT Start Time 0830    PT Stop Time 0910    PT Time Calculation (min) 40 min    Activity Tolerance Patient tolerated treatment well    Behavior During Therapy WFL for tasks assessed/performed                       Past Medical History:  Diagnosis Date   Hypertension    PTSD (post-traumatic stress disorder)    Past Surgical History:  Procedure Laterality Date   FOOT SURGERY     NECK SURGERY     SHOULDER SURGERY     There are no problems to display for this patient.   REFERRING DIAG: right shoulder pain    THERAPY DIAG:  Acute pain of right shoulder  Muscle weakness (generalized)  Localized edema  Rationale for Evaluation and Treatment Rehabilitation  PERTINENT HISTORY: HTN, PTSD, Cervical fusion, L RTC repair   PRECAUTIONS: Arthroscopy R shoulder 04/22/22  SUBJECTIVE:                                                                                                                                                                                      SUBJECTIVE STATEMENT:  Pt presents to PT with reports of continued R shoulder discomfort. Has been compliant with HEP. He had visit with MD yesterday, notes he needs to be further with AROM.    PAIN:  Are you having pain?  Yes: NPRS scale: 6/10 Worst: 9/10  Pain location: right shoulder  Pain description: tight, sore Aggravating factors: laying on R side Relieving factors: rest   OBJECTIVE: (objective measures completed at initial evaluation unless otherwise dated)  PATIENT SURVEYS:  FOTO: 43% function;  63% predicted  55% function - 07/02/2022 53% function - 07/29/22   UPPER EXTREMITY ROM:    AROM/PROM Right AROM Right PROM Right AROM AAROM R 07/16/22 Right AROM 07/29/22  Shoulder flexion 85 125 117 135 75  Shoulder extension         Shoulder abduction 70 90   70  Shoulder adduction         Shoulder internal rotation   70     Shoulder external  rotation   40     Elbow flexion         Elbow extension         Wrist flexion         Wrist extension         Wrist ulnar deviation         Wrist radial deviation         Wrist pronation         Wrist supination         (Blank rows = not tested)   UPPER EXTREMITY MMT:   MMT Right 07/29/22  Shoulder flexion    Shoulder extension    Shoulder abduction    Shoulder adduction    Shoulder internal rotation 3/5   Shoulder external rotation 3/5   Middle trapezius    Lower trapezius    Elbow flexion    Elbow extension    Wrist flexion    Wrist extension    Wrist ulnar deviation    Wrist radial deviation    Wrist pronation    Wrist supination    Grip strength (lbs)    (Blank rows = not tested)   PALPATION:  Slight TTP to surgical site during PROM             TREATMENT: [facility] Adult PT Treatment:                                                DATE: 07/29/22 Therapeutic Exercise: UBE lvl 1.5 x 4 min while taking subjective Pulley x 2 min into R shoulder flex Row 2x10 13# R shoulder IR/ER 2x10 red band R shoulder flexion shelf tap to 90 1# - difficult Corner pec stretch 2x30" Seated cane ER AAROM x 15 - 5" hold R S/L R shoulder abd x 10 Supine cane flex AAROM x 15  Therapeutic Activity: Assessment of tests/measures, goals, and outcomes for recert Modalities: Vasopneumatic (Game Ready)   Location:  Rght shoulder Pressure:  Low Temperature:  34 degrees Time: 10 minutes  [facility] Adult PT Treatment:                                                DATE: 07/27/22 Therapeutic Exercise: UBE lvl 1.5 x 4 min while taking  subjective Pulley x 2 min into R shoulder flex Row 2x10 blue band R shoulder IR/ER 2x10 red band UE ranger 43" x 15 Seated cane ER AAROM x 15 - 5" hold R Seated bilateral ER 2x10 red band Supine cane flex AAROM x 15  Supine horizontal abd 2x10 RTB Manual Therapy: Release to R pec minor PROM to R shoulder ER/flexion  [facility] Adult PT Treatment:                                                DATE: 07/22/22 Therapeutic Exercise: UBE lvl 1.5 x 4 min while taking subjective Row 2x10 blue band R shoulder IR/ER 2x10 red band Seated cane ER AAROM x 10 - 5" hold R Seated bilateral ER 2x10 red band Supine cane flex AAROM x 15  Supine horizontal abd 2x10 RTB Seated Fig 4 x 30" - added per MD request S/L clamshell L x 10 - added per MD request Manual Therapy: Release to R pec minor PROM to R shoulder ER  [facility] Adult PT Treatment:                                                DATE: 07/20/22 Therapeutic Exercise: Nustep L2 6 min PNF D1 F/E 5x Seated scaption 15x 4 way scapula 15x against manual resistance Manual Therapy: Trigger Point Dry Needling Treatment: Pre-treatment instruction: Patient instructed on dry needling rationale, procedures, and possible side effects including pain during treatment (achy,cramping feeling), bruising, drop of blood, lightheadedness, nausea, sweating. Patient Consent Given: Yes Education handout provided: Previously provided Muscles treated: R pectoralis major, R teres major and minor  Needle size and number: .30x32mm x 2 Electrical stimulation performed: No Parameters: N/A Treatment response/outcome: Twitch response elicited, Palpable decrease in muscle tension, and increased AROM Post-treatment instructions: Patient instructed to expect possible mild to moderate muscle soreness later today and/or tomorrow. Patient instructed in methods to reduce muscle soreness and to continue prescribed HEP. If patient was dry needled over the lung field, patient was  instructed on signs and symptoms of pneumothorax and, however unlikely, to see immediate medical attention should they occur. Patient was also educated on signs and symptoms of infection and to seek medical attention should they occur. Patient verbalized understanding of these instructions and education.   PATIENT EDUCATION: Education details: HEP update and POC extension Person educated: Patient Education method: Explanation, Demonstration, and Handouts Education comprehension: verbalized understanding and returned demonstration   HOME EXERCISE PROGRAM: Access Code: [email or web address] URL: [email or web address] Date: 06/30/2022 Prepared by: Octavio Manns  Exercises - Supine Shoulder Flexion Extension AAROM with Dowel  - 2-3 x daily - 7 x weekly - 2 sets - 10 reps - Standing Shoulder Row with Anchored Resistance  - 2-3 x daily - 7 x weekly - 3 sets - 10 reps - blue band hold - Seated Shoulder Flexion Towel Slide at Table Top  - 2-3 x daily - 7 x weekly - 2 sets - 10 reps - 5 sec hold - Seated Shoulder Abduction Towel Slide at Table Top  - 2-3 x daily - 7 x weekly - 2 sets - 10 reps - 5 sec hold - Standing Isometric Shoulder Internal Rotation with Towel Roll at Doorway  - 1 x daily - 7 x weekly - 2 sets - 10 reps - 3 sec hold - Standing Isometric Shoulder External Rotation with Doorway  - 1 x daily - 7 x weekly - 2 sets - 10 reps - 3 sec hold - Sidelying Shoulder External Rotation  - 1 x daily - 7 x weekly - 2 sets - 10 reps   ASSESSMENT:   CLINICAL IMPRESSION: Pt was able to complete all prescribed exercises with no adverse effect, continues to struggle with AROM against gravity. Therapy today continued to focus on improving periscapular/RTC strength and R shoulder ROM post surgery. Pt showed increased AROM and FOTO score today and continues to have [facility] PROM. He continues to require skilled PT services working on improving strength and range post operatively, with POC extension and  request for more authorized visits.    OBJECTIVE IMPAIRMENTS: decreased activity tolerance, decreased mobility, decreased ROM, decreased strength, and pain.  ACTIVITY LIMITATIONS: carrying, lifting, bathing, dressing, and reach over head   PARTICIPATION LIMITATIONS: driving, shopping, community activity, occupation, and yard work   PERSONAL FACTORS: 1-2 comorbidities: HTN, PTSD, Cervical fusion, L RTC repair  are also affecting patient's functional outcome.      GOALS: Goals reviewed with patient? No   SHORT TERM GOALS: Target date: 06/25/2022   Pt will be compliant and knowledgeable with initial HEP for improved comfort and carryover Baseline: initial HEP given  Goal status: MET Pt reports adherence 06/13/22   2.  Pt will self report right shoulder pain no greater than 6/10 for improved comfort and functional ability Baseline: 10/10 at worst; 07/16/22 6/10 Goal status: MET   LONG TERM GOALS: Target date: 09/23/2022   Pt will improve FOTO function score to no less than 63% as proxy for functional improvement Baseline: 43% function 07/02/2022: 55% function 07/16/2022: 51% function Goal status:ONGOING   2.  Pt will self report right shoulder pain no greater than 2/10 for improved comfort and functional ability Baseline: 9/10 at worst Goal status: ONGOING    3.  Pt will improve R shoulder AROM into flex/abd to no less than 145 for improved functional ability with home ADLs and community activity Baseline: see chart Goal status: ONGOING   4.  Pt will be able to play drums not limited by R shoulder pain for improved comfort and getting back to desired recreational activity Baseline: unable Goal status: ONGOING  5.  Pt will improve R shoulder IR/ER MMT to no less than 4/5 for improved dynamic stabilization and comfort with overhead movements Baseline: 3/5 Goal status: NEW  PLAN:   PT FREQUENCY: 2x/week   PT DURATION: 8 weeks   PLANNED INTERVENTIONS: Therapeutic  exercises, Therapeutic activity, Neuromuscular re-education, Balance training, Gait training, Patient/Family education, Self Care, Joint mobilization, Dry Needling, Electrical stimulation, Cryotherapy, Moist heat, Vasopneumatic device, Manual therapy, and Re-evaluation   PLAN FOR NEXT SESSION: assess HEP response, progress per protocol    Ward Chatters, PT 07/29/2022, 11:51 AM

## 2022-07-29 ENCOUNTER — Ambulatory Visit: Payer: No Typology Code available for payment source

## 2022-07-29 DIAGNOSIS — M6281 Muscle weakness (generalized): Secondary | ICD-10-CM

## 2022-07-29 DIAGNOSIS — R600 Localized edema: Secondary | ICD-10-CM

## 2022-07-29 DIAGNOSIS — M25511 Pain in right shoulder: Secondary | ICD-10-CM

## 2022-08-03 ENCOUNTER — Ambulatory Visit: Payer: No Typology Code available for payment source | Attending: Physician Assistant

## 2022-08-03 DIAGNOSIS — M25511 Pain in right shoulder: Secondary | ICD-10-CM | POA: Insufficient documentation

## 2022-08-03 DIAGNOSIS — R600 Localized edema: Secondary | ICD-10-CM | POA: Diagnosis present

## 2022-08-03 DIAGNOSIS — M6281 Muscle weakness (generalized): Secondary | ICD-10-CM | POA: Insufficient documentation

## 2022-08-03 NOTE — Therapy (Signed)
OUTPATIENT PHYSICAL THERAPY TREATMENT NOTE   Patient Name: Wayne Bartlett MRN: [id number] DOB:1965-11-13, 57 y.o., male Today's Date: 08/03/2022  [facility]  REFERRING PROVIDER: Blair Hailey, PA-C   END OF SESSION:   PT End of Session - 08/03/22 0741     Visit Number 15    Number of Visits 30    Date for PT Re-Evaluation 09/23/22    [facility]ization Type [facility]    PT Start Time 0745    PT Stop Time 0825    PT Time Calculation (min) 40 min    Activity Tolerance Patient tolerated treatment well    Behavior During Therapy WFL for tasks assessed/performed                        Past Medical History:  Diagnosis Date   Hypertension    PTSD (post-traumatic stress disorder)    Past Surgical History:  Procedure Laterality Date   FOOT SURGERY     NECK SURGERY     SHOULDER SURGERY     There are no problems to display for this patient.   REFERRING DIAG: right shoulder pain    THERAPY DIAG:  Acute pain of right shoulder  Muscle weakness (generalized)  Localized edema  Rationale for Evaluation and Treatment Rehabilitation  PERTINENT HISTORY: HTN, PTSD, Cervical fusion, L RTC repair   PRECAUTIONS: Arthroscopy R shoulder 04/22/22  SUBJECTIVE:                                                                                                                                                                                      SUBJECTIVE STATEMENT:  Pt presents to PT with reports of decreased R shoulder pain. Has continued HEP compliance with no adverse effect.    PAIN:  Are you having pain?  Yes: NPRS scale: 4/10 Worst: 9/10  Pain location: right shoulder  Pain description: tight, sore Aggravating factors: laying on R side Relieving factors: rest   OBJECTIVE: (objective measures completed at initial evaluation unless otherwise dated)  PATIENT SURVEYS:  FOTO: 43% function; 63% predicted  55% function - 07/02/2022 53% function - 07/29/22   UPPER EXTREMITY  ROM:    AROM/PROM Right AROM Right PROM Right AROM AAROM R 07/16/22 Right AROM 07/29/22  Shoulder flexion 85 125 117 135 75  Shoulder extension         Shoulder abduction 70 90   70  Shoulder adduction         Shoulder internal rotation   70     Shoulder external rotation   40     Elbow flexion  Elbow extension         Wrist flexion         Wrist extension         Wrist ulnar deviation         Wrist radial deviation         Wrist pronation         Wrist supination         (Blank rows = not tested)   UPPER EXTREMITY MMT:   MMT Right 07/29/22  Shoulder flexion    Shoulder extension    Shoulder abduction    Shoulder adduction    Shoulder internal rotation 3/5   Shoulder external rotation 3/5   Middle trapezius    Lower trapezius    Elbow flexion    Elbow extension    Wrist flexion    Wrist extension    Wrist ulnar deviation    Wrist radial deviation    Wrist pronation    Wrist supination    Grip strength (lbs)    (Blank rows = not tested)   PALPATION:  Slight TTP to surgical site during PROM             TREATMENT: [facility] Adult PT Treatment:                                                DATE: 08/03/22 Therapeutic Exercise: UBE lvl 1.5 x 4 min while taking subjective Pulley x 2 min into R shoulder flex Row 2x12 17# R shoulder IR/ER 2x10 3# R shoulder flexion shelf tap to 90 1# - difficult Corner pec stretch 2x30" Supine cane flex AAROM x 15 2# Supine chest press x 10 2# Manual Therapy: PROM into ER STM and release to R pec minor/major Modalities: Vasopneumatic (Game Ready)   Location:  Rght shoulder Pressure:  Low Temperature:  34 degrees Time: 10 minutes  [facility] Adult PT Treatment:                                                DATE: 07/29/22 Therapeutic Exercise: UBE lvl 1.5 x 4 min while taking subjective Pulley x 2 min into R shoulder flex Row 2x10 13# R shoulder IR/ER 2x10 red band R shoulder flexion shelf tap to 90 1# -  difficult Corner pec stretch 2x30" Seated cane ER AAROM x 15 - 5" hold R S/L R shoulder abd x 10 Supine cane flex AAROM x 15  Therapeutic Activity: Assessment of tests/measures, goals, and outcomes for recert Modalities: Vasopneumatic (Game Ready)   Location:  Rght shoulder Pressure:  Low Temperature:  34 degrees Time: 10 minutes  [facility] Adult PT Treatment:                                                DATE: 07/27/22 Therapeutic Exercise: UBE lvl 1.5 x 4 min while taking subjective Pulley x 2 min into R shoulder flex Row 2x10 blue band R shoulder IR/ER 2x10 red band UE ranger 43" x 15 Seated cane ER AAROM x 15 - 5" hold R Seated bilateral ER 2x10 red band  Supine cane flex AAROM x 15  Supine horizontal abd 2x10 RTB Manual Therapy: Release to R pec minor PROM to R shoulder ER/flexion  [facility] Adult PT Treatment:                                                DATE: 07/22/22 Therapeutic Exercise: UBE lvl 1.5 x 4 min while taking subjective Row 2x10 blue band R shoulder IR/ER 2x10 red band Seated cane ER AAROM x 10 - 5" hold R Seated bilateral ER 2x10 red band Supine cane flex AAROM x 15  Supine horizontal abd 2x10 RTB Seated Fig 4 x 30" - added per MD request S/L clamshell L x 10 - added per MD request Manual Therapy: Release to R pec minor PROM to R shoulder ER  [facility] Adult PT Treatment:                                                DATE: 07/20/22 Therapeutic Exercise: Nustep L2 6 min PNF D1 F/E 5x Seated scaption 15x 4 way scapula 15x against manual resistance Manual Therapy: Trigger Point Dry Needling Treatment: Pre-treatment instruction: Patient instructed on dry needling rationale, procedures, and possible side effects including pain during treatment (achy,cramping feeling), bruising, drop of blood, lightheadedness, nausea, sweating. Patient Consent Given: Yes Education handout provided: Previously provided Muscles treated: R pectoralis major, R teres major and minor   Needle size and number: .30x40mm x 2 Electrical stimulation performed: No Parameters: N/A Treatment response/outcome: Twitch response elicited, Palpable decrease in muscle tension, and increased AROM Post-treatment instructions: Patient instructed to expect possible mild to moderate muscle soreness later today and/or tomorrow. Patient instructed in methods to reduce muscle soreness and to continue prescribed HEP. If patient was dry needled over the lung field, patient was instructed on signs and symptoms of pneumothorax and, however unlikely, to see immediate medical attention should they occur. Patient was also educated on signs and symptoms of infection and to seek medical attention should they occur. Patient verbalized understanding of these instructions and education.   PATIENT EDUCATION: Education details: HEP update and POC extension Person educated: Patient Education method: Explanation, Demonstration, and Handouts Education comprehension: verbalized understanding and returned demonstration   HOME EXERCISE PROGRAM: Access Code: [email or web address] URL: [email or web address] Date: 06/30/2022 Prepared by: Octavio Manns  Exercises - Supine Shoulder Flexion Extension AAROM with Dowel  - 2-3 x daily - 7 x weekly - 2 sets - 10 reps - Standing Shoulder Row with Anchored Resistance  - 2-3 x daily - 7 x weekly - 3 sets - 10 reps - blue band hold - Seated Shoulder Flexion Towel Slide at Table Top  - 2-3 x daily - 7 x weekly - 2 sets - 10 reps - 5 sec hold - Seated Shoulder Abduction Towel Slide at Table Top  - 2-3 x daily - 7 x weekly - 2 sets - 10 reps - 5 sec hold - Standing Isometric Shoulder Internal Rotation with Towel Roll at Doorway  - 1 x daily - 7 x weekly - 2 sets - 10 reps - 3 sec hold - Standing Isometric Shoulder External Rotation with Doorway  - 1 x daily - 7 x weekly - 2 sets - 10 reps -  3 sec hold - Sidelying Shoulder External Rotation  - 1 x daily - 7 x weekly - 2 sets - 10  reps   ASSESSMENT:   CLINICAL IMPRESSION: Pt was able to complete prescribed exercises with no adverse effect. Therapy continued to work on improving R shoulder ROM and periscapular/RTC strength today post op in order to improve function. Able to progress ER PROM today. Pt continues to require skilled PT services, will continue to progress as tolerated per POC.     OBJECTIVE IMPAIRMENTS: decreased activity tolerance, decreased mobility, decreased ROM, decreased strength, and pain.    ACTIVITY LIMITATIONS: carrying, lifting, bathing, dressing, and reach over head   PARTICIPATION LIMITATIONS: driving, shopping, community activity, occupation, and yard work   PERSONAL FACTORS: 1-2 comorbidities: HTN, PTSD, Cervical fusion, L RTC repair  are also affecting patient's functional outcome.      GOALS: Goals reviewed with patient? No   SHORT TERM GOALS: Target date: 06/25/2022   Pt will be compliant and knowledgeable with initial HEP for improved comfort and carryover Baseline: initial HEP given  Goal status: MET Pt reports adherence 06/13/22   2.  Pt will self report right shoulder pain no greater than 6/10 for improved comfort and functional ability Baseline: 10/10 at worst; 07/16/22 6/10 Goal status: MET   LONG TERM GOALS: Target date: 09/23/2022   Pt will improve FOTO function score to no less than 63% as proxy for functional improvement Baseline: 43% function 07/02/2022: 55% function 07/16/2022: 51% function Goal status:ONGOING   2.  Pt will self report right shoulder pain no greater than 2/10 for improved comfort and functional ability Baseline: 9/10 at worst Goal status: ONGOING    3.  Pt will improve R shoulder AROM into flex/abd to no less than 145 for improved functional ability with home ADLs and community activity Baseline: see chart Goal status: ONGOING   4.  Pt will be able to play drums not limited by R shoulder pain for improved comfort and getting back to desired  recreational activity Baseline: unable Goal status: ONGOING  5.  Pt will improve R shoulder IR/ER MMT to no less than 4/5 for improved dynamic stabilization and comfort with overhead movements Baseline: 3/5 Goal status: NEW  PLAN:   PT FREQUENCY: 2x/week   PT DURATION: 8 weeks   PLANNED INTERVENTIONS: Therapeutic exercises, Therapeutic activity, Neuromuscular re-education, Balance training, Gait training, Patient/Family education, Self Care, Joint mobilization, Dry Needling, Electrical stimulation, Cryotherapy, Moist heat, Vasopneumatic device, Manual therapy, and Re-evaluation   PLAN FOR NEXT SESSION: assess HEP response, progress per protocol    Ward Chatters, PT 08/03/2022, 8:34 AM

## 2022-08-05 ENCOUNTER — Ambulatory Visit: Payer: No Typology Code available for payment source

## 2022-08-05 DIAGNOSIS — M25511 Pain in right shoulder: Secondary | ICD-10-CM

## 2022-08-05 DIAGNOSIS — M6281 Muscle weakness (generalized): Secondary | ICD-10-CM

## 2022-08-05 DIAGNOSIS — R600 Localized edema: Secondary | ICD-10-CM

## 2022-08-05 NOTE — Therapy (Signed)
OUTPATIENT PHYSICAL THERAPY TREATMENT NOTE   Patient Name: Wayne Bartlett MRN: [id number] DOB:1966-04-23, 57 y.o., male Today's Date: 08/05/2022  [facility]  REFERRING PROVIDER: Blair Hailey, PA-C   END OF SESSION:   PT End of Session - 08/05/22 0826     Visit Number 16    Number of Visits 30    Date for PT Re-Evaluation 09/23/22    [facility]    Authorization Time Period 15 visits 05/12/22-09/09/22    Authorization - Visit Number 15    Authorization - Number of Visits 15    PT Start Time 0830    PT Stop Time 0920    PT Time Calculation (min) 50 min    Activity Tolerance Patient tolerated treatment well    Behavior During Therapy WFL for tasks assessed/performed              Past Medical History:  Diagnosis Date   Hypertension    PTSD (post-traumatic stress disorder)    Past Surgical History:  Procedure Laterality Date   FOOT SURGERY     NECK SURGERY     SHOULDER SURGERY     There are no problems to display for this patient.   REFERRING DIAG: right shoulder pain    THERAPY DIAG:  Acute pain of right shoulder  Muscle weakness (generalized)  Localized edema  Rationale for Evaluation and Treatment Rehabilitation  PERTINENT HISTORY: HTN, PTSD, Cervical fusion, L RTC repair   PRECAUTIONS: Arthroscopy R shoulder 04/22/22  SUBJECTIVE:                                                                                                                                                                                      SUBJECTIVE STATEMENT:  Patient reports increased pain today that he attributes to the weather. He is interested in [facility] for his pec this session.    PAIN:  Are you having pain?  Yes: NPRS scale: 5/10 Worst: 9/10  Pain location: right shoulder  Pain description: tight, sore Aggravating factors: laying on R side Relieving factors: rest   OBJECTIVE: (objective measures completed at initial evaluation unless otherwise  dated)  PATIENT SURVEYS:  FOTO: 43% function; 63% predicted  55% function - 07/02/2022 53% function - 07/29/22   UPPER EXTREMITY ROM:    AROM/PROM Right AROM Right PROM Right AROM AAROM R 07/16/22 Right AROM 07/29/22  Shoulder flexion 85 125 117 135 75  Shoulder extension         Shoulder abduction 70 90   70  Shoulder adduction         Shoulder internal rotation   70     Shoulder  external rotation   40     Elbow flexion         Elbow extension         Wrist flexion         Wrist extension         Wrist ulnar deviation         Wrist radial deviation         Wrist pronation         Wrist supination         (Blank rows = not tested)   UPPER EXTREMITY MMT:   MMT Right 07/29/22  Shoulder flexion    Shoulder extension    Shoulder abduction    Shoulder adduction    Shoulder internal rotation 3/5   Shoulder external rotation 3/5   Middle trapezius    Lower trapezius    Elbow flexion    Elbow extension    Wrist flexion    Wrist extension    Wrist ulnar deviation    Wrist radial deviation    Wrist pronation    Wrist supination    Grip strength (lbs)    (Blank rows = not tested)   PALPATION:  Slight TTP to surgical site during PROM             TREATMENT: [facility] Adult PT Treatment:                                                DATE: 08/05/22 Therapeutic Exercise: UBE lvl 1.5 x 2'/2' fwd/bwd min while taking subjective Row 2x12 17# R shoulder IR/ER x10 3# R shoulder flexion shelf tap to 90 1# - x 10 - difficult Corner pec stretch 2x30" Supine cane flex AAROM x 15 2# Supine chest press 2 x 10 2# Supine shoulder flexion AROM Rt x10 Manual Therapy (performed by certified therapist Octavio Manns, DPT): Skilled palpation of muscles to identify trigger points in Rt pec major Trigger Point Dry-Needling  Treatment instructions: Expect mild to moderate muscle soreness. S/S of pneumothorax if dry needled over a lung field, and to seek immediate medical attention  should they occur. Patient verbalized understanding of these instructions and education. Size: .30x.50 Patient Consent Given: Yes Education handout provided: Previously provided Muscles treated: Rt pec major Electrical stimulation performed: No Parameters: N/A Treatment response/outcome: Twitch response, decreased tension Modalities: Vasopneumatic (Game Ready)   Location:  Rght shoulder Pressure:  Low Temperature:  34 degrees Time: 10 minutes   [facility] Adult PT Treatment:                                                DATE: 08/03/22 Therapeutic Exercise: UBE lvl 1.5 x 4 min while taking subjective Pulley x 2 min into R shoulder flex Row 2x12 17# R shoulder IR/ER 2x10 3# R shoulder flexion shelf tap to 90 1# - difficult Corner pec stretch 2x30" Supine cane flex AAROM x 15 2# Supine chest press x 10 2# Manual Therapy: PROM into ER STM and release to R pec minor/major Modalities: Vasopneumatic (Game Ready)   Location:  Rght shoulder Pressure:  Low Temperature:  34 degrees Time: 10 minutes  [facility] Adult PT Treatment:  DATE: 07/29/22 Therapeutic Exercise: UBE lvl 1.5 x 4 min while taking subjective Pulley x 2 min into R shoulder flex Row 2x10 13# R shoulder IR/ER 2x10 red band R shoulder flexion shelf tap to 90 1# - difficult Corner pec stretch 2x30" Seated cane ER AAROM x 15 - 5" hold R S/L R shoulder abd x 10 Supine cane flex AAROM x 15  Therapeutic Activity: Assessment of tests/measures, goals, and outcomes for recert Modalities: Vasopneumatic (Game Ready)   Location:  Rght shoulder Pressure:  Low Temperature:  34 degrees Time: 10 minutes    PATIENT EDUCATION: Education details: HEP update and POC extension Person educated: Patient Education method: Explanation, Demonstration, and Handouts Education comprehension: verbalized understanding and returned demonstration   HOME EXERCISE PROGRAM: Access Code:  [email or web address] URL: [email or web address] Date: 06/30/2022 Prepared by: Octavio Manns  Exercises - Supine Shoulder Flexion Extension AAROM with Dowel  - 2-3 x daily - 7 x weekly - 2 sets - 10 reps - Standing Shoulder Row with Anchored Resistance  - 2-3 x daily - 7 x weekly - 3 sets - 10 reps - blue band hold - Seated Shoulder Flexion Towel Slide at Table Top  - 2-3 x daily - 7 x weekly - 2 sets - 10 reps - 5 sec hold - Seated Shoulder Abduction Towel Slide at Table Top  - 2-3 x daily - 7 x weekly - 2 sets - 10 reps - 5 sec hold - Standing Isometric Shoulder Internal Rotation with Towel Roll at Doorway  - 1 x daily - 7 x weekly - 2 sets - 10 reps - 3 sec hold - Standing Isometric Shoulder External Rotation with Doorway  - 1 x daily - 7 x weekly - 2 sets - 10 reps - 3 sec hold - Sidelying Shoulder External Rotation  - 1 x daily - 7 x weekly - 2 sets - 10 reps   ASSESSMENT:   CLINICAL IMPRESSION:  Patient presents to PT reporting increased pain in his Rt shoulder that he attributes to weather today. TPDN performed at beginning of session to Rt pec major, patient reporting soreness but decreased tension. Session continued to focus on Rt shoulder ROM as well as periscapular and RTC strengthening with particular focus on flexion strengthening. He had the most difficulty with lifting 1# weight to 90 due to weakness. Patient was able to tolerate all prescribed exercises with no adverse effects. Patient continues to benefit from skilled PT services and should be progressed as able to improve functional independence.     OBJECTIVE IMPAIRMENTS: decreased activity tolerance, decreased mobility, decreased ROM, decreased strength, and pain.    ACTIVITY LIMITATIONS: carrying, lifting, bathing, dressing, and reach over head   PARTICIPATION LIMITATIONS: driving, shopping, community activity, occupation, and yard work   PERSONAL FACTORS: 1-2 comorbidities: HTN, PTSD, Cervical fusion, L RTC repair   are also affecting patient's functional outcome.      GOALS: Goals reviewed with patient? No   SHORT TERM GOALS: Target date: 06/25/2022   Pt will be compliant and knowledgeable with initial HEP for improved comfort and carryover Baseline: initial HEP given  Goal status: MET Pt reports adherence 06/13/22   2.  Pt will self report right shoulder pain no greater than 6/10 for improved comfort and functional ability Baseline: 10/10 at worst; 07/16/22 6/10 Goal status: MET   LONG TERM GOALS: Target date: 09/23/2022   Pt will improve FOTO function score to no less than 63% as proxy for functional improvement Baseline: 43%  function 07/02/2022: 55% function 07/16/2022: 51% function Goal status:ONGOING   2.  Pt will self report right shoulder pain no greater than 2/10 for improved comfort and functional ability Baseline: 9/10 at worst Goal status: ONGOING    3.  Pt will improve R shoulder AROM into flex/abd to no less than 145 for improved functional ability with home ADLs and community activity Baseline: see chart Goal status: ONGOING   4.  Pt will be able to play drums not limited by R shoulder pain for improved comfort and getting back to desired recreational activity Baseline: unable Goal status: ONGOING  5.  Pt will improve R shoulder IR/ER MMT to no less than 4/5 for improved dynamic stabilization and comfort with overhead movements Baseline: 3/5 Goal status: NEW  PLAN:   PT FREQUENCY: 2x/week   PT DURATION: 8 weeks   PLANNED INTERVENTIONS: Therapeutic exercises, Therapeutic activity, Neuromuscular re-education, Balance training, Gait training, Patient/Family education, Self Care, Joint mobilization, Dry Needling, Electrical stimulation, Cryotherapy, Moist heat, Vasopneumatic device, Manual therapy, and Re-evaluation   PLAN FOR NEXT SESSION: assess HEP response, progress per protocol    Margarette Canada, PTA 08/05/2022, 8:27 AM

## 2022-08-10 ENCOUNTER — Ambulatory Visit: Payer: No Typology Code available for payment source

## 2022-08-11 NOTE — Therapy (Addendum)
OUTPATIENT PHYSICAL THERAPY TREATMENT NOTE/DISCHARGE  PHYSICAL THERAPY DISCHARGE SUMMARY  Visits from Start of Care: 17  Current functional level related to goals / functional outcomes: See goals/objective   Remaining deficits: Unable to assess   Education / Equipment: HEP   Patient agrees to discharge. Patient goals were unable to assess. Patient is being discharged due to not returning since the last visit.     Patient Name: Wayne Bartlett MRN: [id number] DOB:28-May-1965, 57 y.o., male Today's Date: 08/12/2022  [facility]  REFERRING PROVIDER: Diamond Nickel, PA-C   END OF SESSION:   PT End of Session - 08/12/22 0825     Visit Number 17    Number of Visits 30    Date for PT Re-Evaluation 09/23/22    Authorization Type [facility]: 08/10/2022 -12/08/2022    Authorization Time Period --    Authorization - Visit Number 1    Authorization - Number of Visits 15    PT Start Time 0830    PT Stop Time 0910    PT Time Calculation (min) 40 min    Activity Tolerance Patient tolerated treatment well               Past Medical History:  Diagnosis Date   Hypertension    PTSD (post-traumatic stress disorder)    Past Surgical History:  Procedure Laterality Date   FOOT SURGERY     NECK SURGERY     SHOULDER SURGERY     There are no problems to display for this patient.   REFERRING DIAG: right shoulder pain    THERAPY DIAG:  Acute pain of right shoulder  Muscle weakness (generalized)  Localized edema  Rationale for Evaluation and Treatment Rehabilitation  PERTINENT HISTORY: HTN, PTSD, Cervical fusion, L RTC repair   PRECAUTIONS: Arthroscopy R shoulder 04/22/22  SUBJECTIVE:                                                                                                                                                                                      SUBJECTIVE STATEMENT:  Pt presents to PT with no current reports of pain, notes shoulder is stiff. Has been compliant  with HEP with no adverse effect.   PAIN:  Are you having pain?  Yes: NPRS scale: 0/10 Worst: 9/10  Pain location: right shoulder  Pain description: tight, sore Aggravating factors: laying on R side Relieving factors: rest   OBJECTIVE: (objective measures completed at initial evaluation unless otherwise dated)  PATIENT SURVEYS:  FOTO: 43% function; 63% predicted  55% function - 07/02/2022 53% function - 07/29/22   UPPER EXTREMITY ROM:    AROM/PROM Right AROM Right PROM Right  AROM AAROM R 07/16/22 Right AROM 07/29/22  Shoulder flexion 85 125 117 135 75  Shoulder extension         Shoulder abduction 70 90   70  Shoulder adduction         Shoulder internal rotation   70     Shoulder external rotation   40     Elbow flexion         Elbow extension         Wrist flexion         Wrist extension         Wrist ulnar deviation         Wrist radial deviation         Wrist pronation         Wrist supination         (Blank rows = not tested)   UPPER EXTREMITY MMT:   MMT Right 07/29/22  Shoulder flexion    Shoulder extension    Shoulder abduction    Shoulder adduction    Shoulder internal rotation 3/5   Shoulder external rotation 3/5   Middle trapezius    Lower trapezius    Elbow flexion    Elbow extension    Wrist flexion    Wrist extension    Wrist ulnar deviation    Wrist radial deviation    Wrist pronation    Wrist supination    Grip strength (lbs)    (Blank rows = not tested)   PALPATION:  Slight TTP to surgical site during PROM             TREATMENT: [facility] Adult PT Treatment:                                                DATE: 08/12/22 Therapeutic Exercise: UBE lvl 1.5 x 2'/2' fwd/bwd min while taking subjective Pulley flexion x 2 min Row 2x12 20# R shoulder IR/ER x10 3# R shoulder flexion shelf tap to 90 1# - 2x10 Corner pec stretch 2x30" Supine shoulder flexion AROM Rt 2x10 1# Seated bilateral ER x 10 GTB Seated horizontal abd x 10  GTB Manual Therapy: Skilled palpation of muscles to identify trigger points in Rt pec major Trigger Point Dry-Needling:  Treatment instructions: Expect mild to moderate muscle soreness. S/S of pneumothorax if dry needled over a lung field, and to seek immediate medical attention should they occur. Patient verbalized understanding of these instructions and education. Size: .30x.50 Patient Consent Given: Yes Education handout provided: Previously provided Muscles treated: Rt pec major Electrical stimulation performed: No Parameters: N/A Treatment response/outcome: Twitch response, decreased tension Modalities: Vasopneumatic (Game Ready)   Location:  Rght shoulder Pressure:  Low Temperature:  34 degrees Time: 10 minutes  [facility] Adult PT Treatment:                                                DATE: 08/05/22 Therapeutic Exercise: UBE lvl 1.5 x 2'/2' fwd/bwd min while taking subjective Row 2x12 17# R shoulder IR/ER x10 3# R shoulder flexion shelf tap to 90 1# - x 10 - difficult Corner pec stretch 2x30" Supine cane flex AAROM x 15 2# Supine chest press 2 x 10 2# Supine  shoulder flexion AROM Rt x10 Manual Therapy (performed by certified therapist Edwinna Areola, DPT): Skilled palpation of muscles to identify trigger points in Rt pec major Trigger Point Dry-Needling:  Treatment instructions: Expect mild to moderate muscle soreness. S/S of pneumothorax if dry needled over a lung field, and to seek immediate medical attention should they occur. Patient verbalized understanding of these instructions and education. Size: .30x.50 Patient Consent Given: Yes Education handout provided: Previously provided Muscles treated: Rt pec major Electrical stimulation performed: No Parameters: N/A Treatment response/outcome: Twitch response, decreased tension Modalities: Vasopneumatic (Game Ready)   Location:  Rght shoulder Pressure:  Low Temperature:  34 degrees Time: 10 minutes  PATIENT  EDUCATION: Education details: HEP update and POC extension Person educated: Patient Education method: Explanation, Demonstration, and Handouts Education comprehension: verbalized understanding and returned demonstration   HOME EXERCISE PROGRAM: Access Code: [email or web address] URL: [email or web address] Date: 06/30/2022 Prepared by: Edwinna Areola  Exercises - Supine Shoulder Flexion Extension AAROM with Dowel  - 2-3 x daily - 7 x weekly - 2 sets - 10 reps - Standing Shoulder Row with Anchored Resistance  - 2-3 x daily - 7 x weekly - 3 sets - 10 reps - blue band hold - Seated Shoulder Flexion Towel Slide at Table Top  - 2-3 x daily - 7 x weekly - 2 sets - 10 reps - 5 sec hold - Seated Shoulder Abduction Towel Slide at Table Top  - 2-3 x daily - 7 x weekly - 2 sets - 10 reps - 5 sec hold - Standing Isometric Shoulder Internal Rotation with Towel Roll at Doorway  - 1 x daily - 7 x weekly - 2 sets - 10 reps - 3 sec hold - Standing Isometric Shoulder External Rotation with Doorway  - 1 x daily - 7 x weekly - 2 sets - 10 reps - 3 sec hold - Sidelying Shoulder External Rotation  - 1 x daily - 7 x weekly - 2 sets - 10 reps   ASSESSMENT:   CLINICAL IMPRESSION:  Pt was able to complete prescribed exercises with no adverse effect. Therapy continued to work on improving R shoulder ROM and periscapular/RTC strength today post op in order to improve function. Responded well to TPDN with decreased pain noted. Was able to perform R shoulder flexion against gravity better today, increased to second set. Pt continues to require skilled PT services, will continue to progress as tolerated per POC.     OBJECTIVE IMPAIRMENTS: decreased activity tolerance, decreased mobility, decreased ROM, decreased strength, and pain.    ACTIVITY LIMITATIONS: carrying, lifting, bathing, dressing, and reach over head   PARTICIPATION LIMITATIONS: driving, shopping, community activity, occupation, and yard work   PERSONAL  FACTORS: 1-2 comorbidities: HTN, PTSD, Cervical fusion, L RTC repair  are also affecting patient's functional outcome.      GOALS: Goals reviewed with patient? No   SHORT TERM GOALS: Target date: 06/25/2022   Pt will be compliant and knowledgeable with initial HEP for improved comfort and carryover Baseline: initial HEP given  Goal status: MET Pt reports adherence 06/13/22   2.  Pt will self report right shoulder pain no greater than 6/10 for improved comfort and functional ability Baseline: 10/10 at worst; 07/16/22 6/10 Goal status: MET   LONG TERM GOALS: Target date: 09/23/2022   Pt will improve FOTO function score to no less than 63% as proxy for functional improvement Baseline: 43% function 07/02/2022: 55% function 07/16/2022: 51% function Goal status:ONGOING   2.  Pt will  self report right shoulder pain no greater than 2/10 for improved comfort and functional ability Baseline: 9/10 at worst Goal status: ONGOING    3.  Pt will improve R shoulder AROM into flex/abd to no less than 145 for improved functional ability with home ADLs and community activity Baseline: see chart Goal status: ONGOING   4.  Pt will be able to play drums not limited by R shoulder pain for improved comfort and getting back to desired recreational activity Baseline: unable Goal status: ONGOING  5.  Pt will improve R shoulder IR/ER MMT to no less than 4/5 for improved dynamic stabilization and comfort with overhead movements Baseline: 3/5 Goal status: NEW  PLAN:   PT FREQUENCY: 2x/week   PT DURATION: 8 weeks   PLANNED INTERVENTIONS: Therapeutic exercises, Therapeutic activity, Neuromuscular re-education, Balance training, Gait training, Patient/Family education, Self Care, Joint mobilization, Dry Needling, Electrical stimulation, Cryotherapy, Moist heat, Vasopneumatic device, Manual therapy, and Re-evaluation   PLAN FOR NEXT SESSION: assess HEP response, progress per protocol    Eloy End,  PT 08/12/2022, 9:12 AM

## 2022-08-12 ENCOUNTER — Ambulatory Visit: Payer: No Typology Code available for payment source

## 2022-08-12 DIAGNOSIS — R600 Localized edema: Secondary | ICD-10-CM

## 2022-08-12 DIAGNOSIS — M6281 Muscle weakness (generalized): Secondary | ICD-10-CM

## 2022-08-12 DIAGNOSIS — M25511 Pain in right shoulder: Secondary | ICD-10-CM

## 2022-10-21 ENCOUNTER — Other Ambulatory Visit (HOSPITAL_COMMUNITY): Payer: Self-pay

## 2022-10-21 MED ORDER — AZITHROMYCIN 250 MG PO TABS
ORAL_TABLET | ORAL | 0 refills | Status: AC
  Filled 2022-10-21: qty 6, 5d supply, fill #0

## 2022-11-12 ENCOUNTER — Other Ambulatory Visit (HOSPITAL_COMMUNITY): Payer: Self-pay

## 2022-11-12 MED ORDER — AMOXICILLIN-POT CLAVULANATE 875-125 MG PO TABS
1.0000 | ORAL_TABLET | Freq: Two times a day (BID) | ORAL | 0 refills | Status: DC
  Filled 2022-11-12: qty 20, 10d supply, fill #0

## 2023-03-29 ENCOUNTER — Ambulatory Visit: Payer: No Typology Code available for payment source

## 2023-03-29 ENCOUNTER — Ambulatory Visit
Admission: EM | Admit: 2023-03-29 | Discharge: 2023-03-29 | Disposition: A | Discharge status: Home / Self Care | Payer: No Typology Code available for payment source | Attending: Nurse Practitioner | Admitting: Nurse Practitioner

## 2023-03-29 ENCOUNTER — Other Ambulatory Visit (HOSPITAL_COMMUNITY): Payer: Self-pay

## 2023-03-29 DIAGNOSIS — J189 Pneumonia, unspecified organism: Secondary | ICD-10-CM

## 2023-03-29 DIAGNOSIS — J188 Other pneumonia, unspecified organism: Secondary | ICD-10-CM

## 2023-03-29 LAB — POC COVID19/FLU A&B COMBO
Covid Antigen, POC: NEGATIVE
Influenza A Antigen, POC: NEGATIVE
Influenza B Antigen, POC: NEGATIVE

## 2023-03-29 MED ORDER — CEFTRIAXONE SODIUM 1 G IJ SOLR
1.0000 g | Freq: Once | INTRAMUSCULAR | Status: AC
  Administered 2023-03-29: 1 g via INTRAMUSCULAR

## 2023-03-29 MED ORDER — LEVOFLOXACIN 750 MG PO TABS
750.0000 mg | ORAL_TABLET | Freq: Every day | ORAL | 0 refills | Status: AC
Start: 2023-03-29 — End: 2023-04-06
  Filled 2023-03-29: qty 7, 7d supply, fill #0

## 2023-03-29 MED ORDER — ACETAMINOPHEN 325 MG PO TABS
650.0000 mg | ORAL_TABLET | Freq: Once | ORAL | Status: AC
  Administered 2023-03-29: 650 mg via ORAL

## 2023-03-29 NOTE — Discharge Instructions (Addendum)
Your COVID, Influenza and Strep Test are all negative.  Your x-ray is suspicious for pneumonia.  You have been given ceftriaxone 1000 mg x1 you are also being treated with We encourage you to use Tylenol alternating with Ibuprofen for your fever if not contraindicated. (Remember to use as directed do not exceed daily dosing recommendations) right  The recommendation is for you to follow-up with your primary care provider.  He should be receiving annual low dose CT scan per the American cancer Society guidelines for lung cancer screening. We also encourage salt water gargles for your sore throat. You should also consider throat lozenges and chloraseptic spray.  Your cough can be soothed with a cough suppressant. We have prescribed you a cough suppressant to be taken as  directed.

## 2023-03-29 NOTE — ED Provider Notes (Signed)
[facility]    CSN: [id number] Arrival date & time: 03/29/23  1554      History   Chief Complaint Chief Complaint  Patient presents with   Cough   Shortness of Breath    HPI Statler Broas is a 57 y.o. male.   HPI  He is in today with his wife for evaluation of shortness of breath and cough for 2 days.  She reports that he developed a fever this morning.  She has done 2 COVID tests which were negative.  He is a smoker.  He reports that the worst symptom is the left lower back pain.  He feels like he has been hit.  He is taking over-the-counter medicine with no relief. Past Medical History:  Diagnosis Date   Hypertension    PTSD (post-traumatic stress disorder)     There are no problems to display for this patient.   Past Surgical History:  Procedure Laterality Date   FOOT SURGERY     NECK SURGERY     SHOULDER SURGERY         Home Medications    Prior to Admission medications   Medication Sig Start Date End Date Taking? Authorizing Provider  levofloxacin (LEVAQUIN) 750 MG tablet Take 1 tablet (750 mg total) by mouth daily for 7 days. 03/29/23 04/05/23 Yes Barbette Merino, NP  amoxicillin-clavulanate (AUGMENTIN) 875-125 MG tablet Take 1 tablet by mouth 2 (two) times daily until gone 11/12/22     calcium carbonate (OS-CAL) 1250 (500 Ca) MG chewable tablet Chew 1 tablet by mouth daily.    [provider]  cyclobenzaprine (FLEXERIL) 10 MG tablet Take 1 tablet (10 mg total) by mouth at bedtime. 05/13/21   White, Elita Boone, NP  diclofenac (VOLTAREN) 75 MG EC tablet Take 1 tablet (75 mg total) by mouth 2 (two) times daily. 05/13/21   White, Elita Boone, NP  oxyCODONE-acetaminophen (PERCOCET) 10-325 MG tablet Take 1 tablet by mouth every 4 (four) hours as needed for pain.    [provider]  potassium phosphate, monobasic, (K-PHOS ORIGINAL) 500 MG tablet Take 500 mg by mouth 4 (four) times daily -  with meals and at bedtime.    [provider]    Family History Family History  Problem Relation Age of Onset   Alzheimer's disease Mother    Stroke Father     Social History Social History   Tobacco Use   Smoking status: Every Day    Types: Cigarettes, Cigars   Smokeless tobacco: Never  Substance Use Topics   Alcohol use: Yes     Allergies   Diazepam and Indomethacin   Review of Systems Review of Systems   Physical Exam Triage Vital Signs ED Triage Vitals  Encounter Vitals Group     BP 03/29/23 1646 (!) 174/98     Systolic BP Percentile --      Diastolic BP Percentile --      Pulse Rate 03/29/23 1645 (!) 121     Resp 03/29/23 1645 (!) 24     Temp 03/29/23 1647 (!) 102.4 F (39.1 C)     Temp Source 03/29/23 1647 Oral     SpO2 03/29/23 1645 92 %     Weight --      Height --      Head Circumference --      Peak Flow --      Pain Score 03/29/23 1643 4     Pain Loc --  Pain Education --      Exclude from Growth Chart --    No data found.  Updated Vital Signs BP (!) 160/93 (BP Location: Right Arm)   Pulse (!) 114   Temp (!) 101.9 F (38.8 C) (Oral)   Resp 18   SpO2 92%   Visual Acuity Right Eye Distance:   Left Eye Distance:   Bilateral Distance:    Right Eye Near:   Left Eye Near:    Bilateral Near:     Physical Exam HENT:     Head: Normocephalic and atraumatic.  Cardiovascular:     Rate and Rhythm: Tachycardia present.  Pulmonary:     Breath sounds: Decreased breath sounds present. No wheezing.  Musculoskeletal:        General: Normal range of motion.     Cervical back: Normal range of motion and neck supple.  Skin:    General: Skin is warm and dry.     Capillary Refill: Capillary refill takes less than 2 seconds.  Neurological:     General: No focal deficit present.     Mental Status: He is alert.      UC Treatments / Results  Labs (all labs ordered are listed, but only abnormal results are displayed) Labs Reviewed  POC COVID19/FLU A&B COMBO     EKG   Radiology No results found.  Procedures Procedures (including critical care time)  Medications Ordered in UC Medications  cefTRIAXone (ROCEPHIN) injection 1 g (has no administration in time range)  acetaminophen (TYLENOL) tablet 650 mg (650 mg Oral Given 03/29/23 1655)    Initial Impression / Assessment and Plan / UC Course  I have reviewed the triage vital signs and the nursing notes.  Pertinent labs & imaging results that were available during my care of the patient were reviewed by me and considered in my medical decision making (see chart for details).     Shortness of breath Final Clinical Impressions(s) / UC Diagnoses   Final diagnoses:  Community acquired pneumonia, unspecified laterality     Discharge Instructions      Your COVID, Influenza and Strep Test are all negative.  Your x-ray is suspicious for pneumonia.  You have been given ceftriaxone 1000 mg x1 you are also being treated with We encourage you to use Tylenol alternating with Ibuprofen for your fever if not contraindicated. (Remember to use as directed do not exceed daily dosing recommendations) right  The recommendation is for you to follow-up with your primary care provider.  He should be receiving annual low dose CT scan per the American cancer Society guidelines for lung cancer screening. We also encourage salt water gargles for your sore throat. You should also consider throat lozenges and chloraseptic spray.  Your cough can be soothed with a cough suppressant. We have prescribed you a cough suppressant to be taken as  directed.       ED Prescriptions     Medication Sig Dispense Auth. Provider   levofloxacin (LEVAQUIN) 750 MG tablet Take 1 tablet (750 mg total) by mouth daily for 7 days. 7 tablet Barbette Merino, NP      PDMP not reviewed this encounter.   Barbette Merino, NP 04/13/23 2030

## 2023-03-29 NOTE — ED Triage Notes (Addendum)
Pt presents with c/o sob and cough X 2 days. Pt states he does smoke and has tried otc medicine. Pt states when he coughs he feels he cannot move. Pt fiance states he had a fever this morning and on previous days.

## 2023-03-30 ENCOUNTER — Other Ambulatory Visit (HOSPITAL_COMMUNITY): Payer: Self-pay

## 2024-01-25 ENCOUNTER — Encounter: Payer: Self-pay | Admitting: Physician Assistant

## 2024-03-16 ENCOUNTER — Other Ambulatory Visit (INDEPENDENT_AMBULATORY_CARE_PROVIDER_SITE_OTHER)

## 2024-03-16 ENCOUNTER — Other Ambulatory Visit (HOSPITAL_COMMUNITY): Payer: Self-pay

## 2024-03-16 ENCOUNTER — Encounter: Payer: Self-pay | Admitting: Physician Assistant

## 2024-03-16 ENCOUNTER — Ambulatory Visit (INDEPENDENT_AMBULATORY_CARE_PROVIDER_SITE_OTHER): Admitting: Physician Assistant

## 2024-03-16 VITALS — BP 134/80 | HR 118 | Ht 73.5 in | Wt 224.5 lb

## 2024-03-16 DIAGNOSIS — K625 Hemorrhage of anus and rectum: Secondary | ICD-10-CM

## 2024-03-16 DIAGNOSIS — R718 Other abnormality of red blood cells: Secondary | ICD-10-CM

## 2024-03-16 DIAGNOSIS — K219 Gastro-esophageal reflux disease without esophagitis: Secondary | ICD-10-CM

## 2024-03-16 DIAGNOSIS — R1011 Right upper quadrant pain: Secondary | ICD-10-CM

## 2024-03-16 DIAGNOSIS — R7989 Other specified abnormal findings of blood chemistry: Secondary | ICD-10-CM

## 2024-03-16 LAB — BASIC METABOLIC PANEL WITH GFR
BUN: 5 mg/dL — ABNORMAL LOW (ref 6–23)
CO2: 28 meq/L (ref 19–32)
Calcium: 9.9 mg/dL (ref 8.4–10.5)
Chloride: 102 meq/L (ref 96–112)
Creatinine, Ser: 0.98 mg/dL (ref 0.40–1.50)
GFR: 84 mL/min (ref >60.00)
Glucose, Bld: 100 mg/dL — ABNORMAL HIGH (ref 70–99)
Potassium: 4.2 meq/L (ref 3.5–5.1)
Sodium: 139 meq/L (ref 135–145)

## 2024-03-16 LAB — CBC WITH DIFFERENTIAL/PLATELET
Basophils Absolute: 0.1 K/uL (ref 0.0–0.1)
Basophils Relative: 1.1 % (ref 0.0–3.0)
Eosinophils Absolute: 0.1 K/uL (ref 0.0–0.7)
Eosinophils Relative: 1.2 % (ref 0.0–5.0)
HCT: 46.8 % (ref 39.0–52.0)
Hemoglobin: 15.2 g/dL (ref 13.0–17.0)
Lymphocytes Relative: 35.7 % (ref 12.0–46.0)
Lymphs Abs: 2.5 K/uL (ref 0.7–4.0)
MCHC: 32.4 g/dL (ref 30.0–36.0)
MCV: 77.8 fl — ABNORMAL LOW (ref 78.0–100.0)
Monocytes Absolute: 0.8 K/uL (ref 0.1–1.0)
Monocytes Relative: 10.8 % (ref 3.0–12.0)
Neutro Abs: 3.6 K/uL (ref 1.4–7.7)
Neutrophils Relative %: 51.2 % (ref 43.0–77.0)
Platelets: 252 K/uL (ref 150.0–400.0)
RBC: 6.02 Mil/uL — ABNORMAL HIGH (ref 4.22–5.81)
RDW: 16.6 % — ABNORMAL HIGH (ref 11.5–15.5)
WBC: 7.1 K/uL (ref 4.0–10.5)

## 2024-03-16 LAB — HEPATIC FUNCTION PANEL
ALT: 26 U/L (ref 0–53)
AST: 20 U/L (ref 0–37)
Albumin: 4.4 g/dL (ref 3.5–5.2)
Alkaline Phosphatase: 96 U/L (ref 39–117)
Bilirubin, Direct: 0.2 mg/dL (ref 0.0–0.3)
Total Bilirubin: 1.2 mg/dL (ref 0.2–1.2)
Total Protein: 7.3 g/dL (ref 6.0–8.3)

## 2024-03-16 LAB — IBC + FERRITIN
Ferritin: 208.1 ng/mL (ref 22.0–322.0)
Iron: 179 ug/dL — ABNORMAL HIGH (ref 42–165)
Saturation Ratios: 57.3 % — ABNORMAL HIGH (ref 20.0–50.0)
TIBC: 312.2 ug/dL (ref 250.0–450.0)
Transferrin: 223 mg/dL (ref 212.0–360.0)

## 2024-03-16 LAB — SEDIMENTATION RATE: Sed Rate: 14 mm/h (ref 0–20)

## 2024-03-16 LAB — VITAMIN B12: Vitamin B-12: 157 pg/mL — ABNORMAL LOW (ref 211–911)

## 2024-03-16 MED ORDER — PEG-KCL-NACL-NASULF-NA ASC-C 100 G PO SOLR
1.0000 | Freq: Once | ORAL | 0 refills | Status: AC
  Filled 2024-03-16: qty 1, 1d supply, fill #0

## 2024-03-16 NOTE — Progress Notes (Signed)
 03/16/2024 Laura Radilla [id number] 1965-11-15  Referring provider: Dasie Perkins, PA Primary GI doctor: Dr. San  ASSESSMENT AND PLAN:   Rectal bleeding, BM 4-6 x a day formed stools, increase in gas, no rectal pain 2017 colonoscopy diverticulosis, recall 10 years No family history of colon polyps/colon cancer Occurs 1-2 times a month, BRB on TP Declines rectal exam - Increase fiber/ water intake, decrease caffeine, increase activity level. -Will schedule for colonoscopy at [facility] to evaluate further with rectal bleeding and recall is 2 years -possible component of pelvis floor dysfunction with history and symptoms.  - consider SIBO testing or treatment   Microcytosis x 2023 -Check iron, ferritin, B12 - if normal can be thalasemia  GERD with vomiting if he lays down after eating Denies NSAIDS, one liqueur drink daily for years No dysphagia, melena On omeprazole with control, increase 40 mg daily with famotidine at night -Lifestyle changes discussed, avoid NSAIDS, ETOH, hand out given to the patient -Schedule EGD at [facility] to evaluate GERD, esophagitis, hiatal hernia,H pylori. I discussed risks of EGD with patient today, including risk of sedation, bleeding or perforation. Patient provides understanding and gave verbal consent to proceed. - RUQ US  with RUQ pain, likely MSK, try salon pas patches  Elevated AST with ETOH with RUQ pain Ferritin within normal limits, negative hepatitis C normal platelets Likely from ETOH -Check CbC, CMET -Check RUQ US  with RUQ discomfort and LFTs, rule out gallbladder  Patient Care Team: Pcp, No as PCP - General  HISTORY OF PRESENT ILLNESS: 58 y.o. male with a past medical history listed below presents for evaluation of rectal bleeding through community care.   Discussed the use of AI scribe software for clinical note transcription with the patient, who gave verbal consent to proceed.  History of Present Illness   Neema Fluegge is a 58 year old male with diverticulosis who presents with rectal bleeding and gastrointestinal symptoms.  He has been experiencing bright red rectal bleeding for about a month. Initially, he attributed it to a change in toilet tissue. There is no associated rectal pain, itching, or burning. He has not experienced significant weight loss over the past three to six months.  He has a history of diverticulosis, identified during a colonoscopy in 2017, which showed no polyps. There has been no change in bowel habits, with bowel movements occurring four to six times a day and being formed without straining. He has been using Miralax for about two months, which has helped with bowel movements but increased gas.  He experiences reflux symptoms, including heartburn and vomiting, particularly when eating late at night or consuming certain foods. He takes omeprazole 40 mg daily and famotidine at night, which has improved his symptoms. No nausea or difficulty swallowing.  He consumes alcohol daily, preferring liquor over beer, and has decreased his cigarette smoking. He has a history of elevated liver function tests, primarily AST.  He experiences right upper quadrant pain described as severe cramps, occurring once a week or every two weeks, often when lying in bed or laughing. He takes medication for spasms twice daily, which has not been effective. The pain is not associated with eating.      He  reports that he has been smoking cigarettes and cigars. He has never used smokeless tobacco. He reports current alcohol use. No history on file for drug use.  RELEVANT GI HISTORY, IMAGING AND LABS: Results   LABS Microcytosis (2021) AST: Elevated  DIAGNOSTIC Colonoscopy: Diverticulosis,  no polyps (2017)      CBC No results found for: WBC, RBC, HGB, HCT, PLT, MCV, MCH, MCHC, RDW, LYMPHSABS, MONOABS, EOSABS, BASOSABS No results for input(s): HGB in the last 8760  hours.  CMP  No results found for: NA, K, CL, CO2, GLUCOSE, BUN, CREATININE, CALCIUM, PROT, ALBUMIN, AST, ALT, ALKPHOS, BILITOT, GFRNONAA, GFRAA     No data to display            Current Medications:      Current Outpatient Medications (Analgesics):    diclofenac  (VOLTAREN ) 75 MG EC tablet, Take 1 tablet (75 mg total) by mouth 2 (two) times daily. (Patient not taking: Reported on 03/16/2024)   oxyCODONE-acetaminophen  (PERCOCET) 10-325 MG tablet, Take 1 tablet by mouth every 4 (four) hours as needed for pain.   Current Outpatient Medications (Other):    peg 3350 powder (MOVIPREP) 100 g SOLR, Take 1 kit (200 g total) by mouth once for 1 dose.   amoxicillin -clavulanate (AUGMENTIN ) 875-125 MG tablet, Take 1 tablet by mouth 2 (two) times daily until gone (Patient not taking: Reported on 03/16/2024)   calcium carbonate (OS-CAL) 1250 (500 Ca) MG chewable tablet, Chew 1 tablet by mouth daily. (Patient not taking: Reported on 03/16/2024)   cyclobenzaprine  (FLEXERIL ) 10 MG tablet, Take 1 tablet (10 mg total) by mouth at bedtime. (Patient not taking: Reported on 03/16/2024)   potassium phosphate, monobasic, (K-PHOS ORIGINAL) 500 MG tablet, Take 500 mg by mouth 4 (four) times daily -  with meals and at bedtime.  Medical History:  Past Medical History:  Diagnosis Date   Arthritis    Chronic headaches    GERD (gastroesophageal reflux disease)    Hypertension    PTSD (post-traumatic stress disorder)    Allergies:  Allergies  Allergen Reactions   Diazepam Other (See Comments)    Other Reaction(s): Hallucination  Hallucinations   Indomethacin      Surgical History:  He  has a past surgical history that includes Shoulder surgery; Neck surgery; and Foot surgery. Family History:  His family history includes Alzheimer's disease in his mother; Stroke in his father.  REVIEW OF SYSTEMS  : All other systems reviewed and negative except where noted in the  History of Present Illness.  PHYSICAL EXAM: BP 134/80   Pulse (!) 118   Ht 6' 1.5 (1.867 m)   Wt 224 lb 8 oz (101.8 kg)   BMI 29.22 kg/m  Physical Exam   GENERAL APPEARANCE: Well nourished, in no apparent distress HEENT: No cervical lymphadenopathy, unremarkable thyroid, sclerae anicteric, conjunctiva pink RESPIRATORY: Respiratory effort normal, BS equal bilateral without rales, rhonchi, wheezing CARDIO: RRR with no MRGs, peripheral pulses intact ABDOMEN: Soft, non distended, active bowel sounds in all 4 quadrants, no tenderness to palpation, no rebound, no mass appreciated RECTAL: declines MUSCULOSKELETAL: Full ROM, normal gait, without edema SKIN: Dry, intact without rashes or lesions. No jaundice. NEURO: Alert, oriented, no focal deficits PSYCH: Cooperative, normal mood and affect.      Alan JONELLE Coombs, PA-C 3:06 PM

## 2024-03-16 NOTE — Patient Instructions (Signed)
 Your provider has requested that you go to the basement level for lab work before leaving today. Press B on the elevator. The lab is located at the first door on the left as you exit the elevator.  Due to recent changes in healthcare laws, you may see the results of your imaging and laboratory studies on MyChart before your provider has had a chance to review them.  We understand that in some cases there may be results that are confusing or concerning to you. Not all laboratory results come back in the same time frame and the provider may be waiting for multiple results in order to interpret others.  Please give us  48 hours in order for your provider to thoroughly review all the results before contacting the office for clarification of your results.    You have been scheduled for an abdominal ultrasound at [facility] (1st floor of hospital) on Monday, 03/27/24 at 8:30 am. Please arrive 15 minutes prior to your appointment for registration. Make certain not to have anything to eat or drink midnight prior to your appointment. Should you need to reschedule your appointment, please contact radiology at [phone]. This test typically takes about 30 minutes to perform.   You have been scheduled for an endoscopy and colonoscopy. Please follow the written instructions given to you at your visit today.  If you use inhalers (even only as needed), please bring them with you on the day of your procedure.  DO NOT TAKE 7 DAYS PRIOR TO TEST- Trulicity (dulaglutide) Ozempic, Wegovy (semaglutide) Mounjaro, Zepbound (tirzepatide) Bydureon Bcise (exanatide extended release)  DO NOT TAKE 1 DAY PRIOR TO YOUR TEST Rybelsus (semaglutide) Adlyxin (lixisenatide) Victoza (liraglutide) Byetta (exanatide) ___________________________________________________________________________   Reflux Gourmet Rescue  It is an ALGINATE THERAPY which is the only intervention that works to safeguard the esophagus by  creating a protective barrier that actually stops reflux from happening. -The general directions for use are as stated on the packaging: Take 1 teaspoon (5 ml), or more as needed or as directed by your physician, after meals and before bed. -These general directions address the most common times for reflux to occur, but our Rescue products may be taken anytime. Some individuals may take our product preemptively, when they know they will suffer from reflux, or as needed - when discomfort arises. (If taken around food, it should be consumed last.) -You do not have to take 1 teaspoon (5 ml) of the product. While one teaspoon (5ml) may be the perfect average amount to relieve reflux suffering in some, others may require more or less. You may adjust the amount of Mint Chocolate Rescue and Vanilla Caramel Rescue to the lowest amount necessary to meet your individual needs to improve your quality of life. -You may dilute the product if it is too viscous for you to consume. Keep in mind, however, that the thickness of the product was formulated to provide optimal coating and protection of your throat and esophagus. Though diluting the product is possible, it may reduce the protective function and/or length of action. -This can be used in conjunction with reflux medications and lifestyle changes.  100% ALL-NATURAL  Paraben FREE, glycerin FREE, & potassium FREE  Made entirely from all-natural ingredients considered safe for children and during pregnancy  No known side effects  All-natural flavor Gluten FREE  Allergen FREE  Vegan  Can find more information here: nameseizer.co.nz   FIBER SUPPLEMENT You can do metamucil or fibercon once or twice a day but  if this causes gas/bloating please switch to Benefiber or Citracel.  Fiber is good for constipation/diarrhea/irritable bowel syndrome.  It can also help with weight loss and can help lower your bad cholesterol (LDL).   Please do 1 TBSP in the morning in water, coffee, or tea.  It can take up to a month before you can see a difference with your bowel movements.  It is cheapest from [employer], [employer], [employer].   Miralax is an osmotic laxative.  It only brings more water into the stool.  This is safe to take daily.  Can take up to 17 gram of miralax twice a day.  Mix with juice or coffee.  Start 1/2 capful for 3-4 days and reassess your response in 3-4 days.  You can increase and decrease the dose based on your response.  Remember, it can take up to 3-4 days to take effect OR for the effects to wear off.   I often pair this with benefiber in the morning to help assure the stool is not too loose.   Small intestinal bacterial overgrowth (SIBO) occurs when there is an abnormal increase in the overall bacterial population in the small intestine -- particularly types of bacteria not commonly found in that part of the digestive tract. Small intestinal bacterial overgrowth (SIBO) commonly results when a circumstance -- such as surgery or disease -- slows the passage of food and waste products in the digestive tract, creating a breeding ground for bacteria.  Signs and symptoms of SIBO often include: Loss of appetite Abdominal pain Nausea Bloating An uncomfortable feeling of fullness after eating Diarrhea or constipation, depending on the type of gas produced  What foods trigger SIBO? While foods aren't the original cause of SIBO, certain foods do encourage the overgrowth of the wrong bacteria in your small intestine. If you're feeding them their favorite foods, they're going to grow more, and that will trigger more of your SIBO symptoms. By the same token, you can help reduce the overgrowth by starving the problematic bacteria of their favorite foods. This strategy has led to a number of proposed SIBO eating plans. The plans vary, and so do individual results. But in general, they tend to recommend limiting  carbohydrates.  These include: Sugars and sweeteners. Fruits and starchy vegetables. Dairy products. Grains.  There is a test for this we can do called a breath test, if you are positive we will treat you with an antibiotic to see if it helps.  Your symptoms are very suspicious for this condition, as discussed, we will start you on an antibiotic to see if this helps.    Thank you for trusting me with your gastrointestinal care!   Alan Coombs, PA-C  _______________________________________________________  If your blood pressure at your visit was 140/90 or greater, please contact your primary care physician to follow up on this.  _______________________________________________________  If you are age 54 or older, your body mass index should be between 23-30. Your Body mass index is 29.22 kg/m. If this is out of the aforementioned range listed, please consider follow up with your Primary Care Provider.  If you are age 104 or younger, your body mass index should be between 19-25. Your Body mass index is 29.22 kg/m. If this is out of the aformentioned range listed, please consider follow up with your Primary Care Provider.   ________________________________________________________  The [facility] providers would like to encourage you to use MYCHART to communicate with providers for non-urgent requests or questions.  Due to long hold times on the telephone, sending your provider a message by [facility] may be a faster and more efficient way to get a response.  Please allow 48 business hours for a response.  Please remember that this is for non-urgent requests.  _______________________________________________________  [facility] is using a team-based approach to care.  Your team is made up of your doctor and two to three APPS. Our APPS (Nurse Practitioners and Physician Assistants) work with your physician to ensure care continuity for you. They are fully qualified to address  your health concerns and develop a treatment plan. They communicate directly with your gastroenterologist to care for you. Seeing the Advanced Practice Practitioners on your physician's team can help you by facilitating care more promptly, often allowing for earlier appointments, access to diagnostic testing, procedures, and other specialty referrals.

## 2024-03-17 ENCOUNTER — Other Ambulatory Visit (HOSPITAL_COMMUNITY): Payer: Self-pay

## 2024-03-17 ENCOUNTER — Ambulatory Visit: Payer: Self-pay | Admitting: Physician Assistant

## 2024-03-23 ENCOUNTER — Other Ambulatory Visit (HOSPITAL_COMMUNITY): Payer: Self-pay

## 2024-03-27 ENCOUNTER — Ambulatory Visit (HOSPITAL_COMMUNITY)
Admission: RE | Admit: 2024-03-27 | Discharge: 2024-03-27 | Disposition: A | Discharge status: Home / Self Care | Source: Ambulatory Visit | Attending: Physician Assistant | Admitting: Physician Assistant

## 2024-03-27 DIAGNOSIS — R1011 Right upper quadrant pain: Secondary | ICD-10-CM | POA: Insufficient documentation

## 2024-03-27 DIAGNOSIS — R718 Other abnormality of red blood cells: Secondary | ICD-10-CM | POA: Insufficient documentation

## 2024-03-27 DIAGNOSIS — K219 Gastro-esophageal reflux disease without esophagitis: Secondary | ICD-10-CM | POA: Insufficient documentation

## 2024-04-12 ENCOUNTER — Ambulatory Visit: Admitting: Gastroenterology

## 2024-04-12 ENCOUNTER — Encounter: Payer: Self-pay | Admitting: Gastroenterology

## 2024-04-12 VITALS — BP 136/90 | HR 99 | Temp 97.9°F | Resp 19 | Ht 73.0 in | Wt 224.0 lb

## 2024-04-12 DIAGNOSIS — K2970 Gastritis, unspecified, without bleeding: Secondary | ICD-10-CM

## 2024-04-12 DIAGNOSIS — D125 Benign neoplasm of sigmoid colon: Secondary | ICD-10-CM | POA: Diagnosis not present

## 2024-04-12 DIAGNOSIS — R7989 Other specified abnormal findings of blood chemistry: Secondary | ICD-10-CM

## 2024-04-12 DIAGNOSIS — R1011 Right upper quadrant pain: Secondary | ICD-10-CM

## 2024-04-12 DIAGNOSIS — K219 Gastro-esophageal reflux disease without esophagitis: Secondary | ICD-10-CM

## 2024-04-12 DIAGNOSIS — D120 Benign neoplasm of cecum: Secondary | ICD-10-CM | POA: Diagnosis present

## 2024-04-12 DIAGNOSIS — K5730 Diverticulosis of large intestine without perforation or abscess without bleeding: Secondary | ICD-10-CM

## 2024-04-12 DIAGNOSIS — K641 Second degree hemorrhoids: Secondary | ICD-10-CM

## 2024-04-12 DIAGNOSIS — D124 Benign neoplasm of descending colon: Secondary | ICD-10-CM | POA: Diagnosis not present

## 2024-04-12 DIAGNOSIS — K625 Hemorrhage of anus and rectum: Secondary | ICD-10-CM

## 2024-04-12 MED ORDER — SODIUM CHLORIDE 0.9 % IV SOLN: 500.0000 mL | Freq: Once | INTRAVENOUS | Status: DC

## 2024-04-12 NOTE — Op Note (Signed)
 [facility] Patient Name: Wayne Bartlett Procedure Date: 04/12/2024 2:53 PM MRN: [id number] Endoscopist: Sandor Flatter , MD, [id number] Age: 58 Referring MD:  Date of Birth: 02-Apr-1966 Gender: Male Account #: [id number] Procedure:                Colonoscopy Indications:              Hematochezia Medicines:                Monitored Anesthesia Care Procedure:                Pre-Anesthesia Assessment:                           - Prior to the procedure, a History and Physical                            was performed, and patient medications and                            allergies were reviewed. The patient's tolerance of                            previous anesthesia was also reviewed. The risks                            and benefits of the procedure and the sedation                            options and risks were discussed with the patient.                            All questions were answered, and informed consent                            was obtained. Prior Anticoagulants: The patient has                            taken no anticoagulant or antiplatelet agents. ASA                            Grade Assessment: III - A patient with severe                            systemic disease. After reviewing the risks and                            benefits, the patient was deemed in satisfactory                            condition to undergo the procedure.                           After obtaining informed consent, the colonoscope  was passed under direct vision. Throughout the                            procedure, the patient's blood pressure, pulse, and                            oxygen saturations were monitored continuously. The                            CF [id number] #[id number] was introduced through the anus                            and advanced to the the terminal ileum. The                            colonoscopy was performed without difficulty.  The                            patient tolerated the procedure well. The quality                            of the bowel preparation was good. The terminal                            ileum, ileocecal valve, appendiceal orifice, and                            rectum were photographed. Scope In: 3:16:02 PM Scope Out: 3:34:24 PM Scope Withdrawal Time: 0 hours 15 minutes 46 seconds  Total Procedure Duration: 0 hours 18 minutes 22 seconds  Findings:                 The perianal and digital rectal examinations were                            normal.                           A 5 mm polyp was found in the cecum. The polyp was                            sessile. The polyp was removed with a cold snare.                            Resection and retrieval were complete. Estimated                            blood loss was minimal.                           Four sessile polyps were found in the sigmoid colon                            (3) and descending colon (1). The polyps were 3 to  6 mm in size. These polyps were removed with a cold                            snare. Resection and retrieval were complete.                            Estimated blood loss was minimal.                           Multiple medium-mouthed and small-mouthed                            diverticula were found in the sigmoid colon,                            descending colon, transverse colon and ascending                            colon.                           Non-bleeding internal hemorrhoids were found during                            retroflexion. The hemorrhoids were small and Grade                            II (internal hemorrhoids that prolapse but reduce                            spontaneously).                           The terminal ileum appeared normal. Complications:            No immediate complications. Estimated Blood Loss:     Estimated blood loss was minimal. Impression:                - One 5 mm polyp in the cecum, removed with a cold                            snare. Resected and retrieved.                           - Four 3 to 6 mm polyps in the sigmoid colon and in                            the descending colon, removed with a cold snare.                            Resected and retrieved.                           - Diverticulosis in the sigmoid colon, in the  descending colon, in the transverse colon and in                            the ascending colon.                           - Non-bleeding internal hemorrhoids.                           - The examined portion of the ileum was normal. Recommendation:           - Patient has a contact number available for                            emergencies. The signs and symptoms of potential                            delayed complications were discussed with the                            patient. Return to normal activities tomorrow.                            Written discharge instructions were provided to the                            patient.                           - Resume previous diet.                           - Continue present medications.                           - Await pathology results.                           - Repeat colonoscopy for surveillance based on                            pathology results.                           - Return to GI office PRN.                           - Internal hemorrhoids were noted on this study and                            may be amenable to hemorrhoid band ligation. If you                            are interested in further treatment of these  hemorrhoids with band ligation, please contact my                            clinic to set up an appointment for evaluation and                            treatment. Sandor Flatter, MD 04/12/2024 3:44:57 PM

## 2024-04-12 NOTE — Progress Notes (Signed)
 Called to room to assist during endoscopic procedure.  Patient ID and intended procedure confirmed with present staff. Received instructions for my participation in the procedure from the performing physician.

## 2024-04-12 NOTE — Progress Notes (Signed)
 Pt's states no medical or surgical changes since previsit or office visit.

## 2024-04-12 NOTE — Op Note (Signed)
 [facility] Patient Name: Wayne Bartlett Procedure Date: 04/12/2024 3:02 PM MRN: [id number] Endoscopist: Sandor Flatter , MD, [id number] Age: 58 Referring MD:  Date of Birth: Feb 08, 1966 Gender: Male Account #: [id number] Procedure:                Upper GI endoscopy Indications:              Abdominal pain in the right upper quadrant,                            Heartburn, Suspected esophageal reflux,                            Regurgitation Medicines:                Monitored Anesthesia Care Procedure:                Pre-Anesthesia Assessment:                           - Prior to the procedure, a History and Physical                            was performed, and patient medications and                            allergies were reviewed. The patient's tolerance of                            previous anesthesia was also reviewed. The risks                            and benefits of the procedure and the sedation                            options and risks were discussed with the patient.                            All questions were answered, and informed consent                            was obtained. Prior Anticoagulants: The patient has                            taken no anticoagulant or antiplatelet agents. ASA                            Grade Assessment: III - A patient with severe                            systemic disease. After reviewing the risks and                            benefits, the patient was deemed in satisfactory  condition to undergo the procedure.                           After obtaining informed consent, the endoscope was                            passed under direct vision. Throughout the                            procedure, the patient's blood pressure, pulse, and                            oxygen saturations were monitored continuously. The                            Olympus Scope [id number] was introduced through the                             mouth, and advanced to the second part of duodenum.                            The upper GI endoscopy was accomplished without                            difficulty. The patient tolerated the procedure                            well. Scope In: Scope Out: Findings:                 The examined esophagus was normal.                           Diffuse mild inflammation characterized by                            congestion (edema) was found in the gastric fundus                            and in the gastric body. Biopsies were taken with a                            cold forceps for histology. Estimated blood loss                            was minimal.                           The incisura and gastric antrum were normal.                            Additional mucosal biopsies were taken with a cold                            forceps for histology and Helicobacter pylori  testing. Estimated blood loss was minimal.                           The examined duodenum was normal. Complications:            No immediate complications. Estimated Blood Loss:     Estimated blood loss was minimal. Impression:               - Normal esophagus.                           - Gastritis. Biopsied.                           - Normal incisura and antrum. Biopsied.                           - Normal examined duodenum. Recommendation:           - Patient has a contact number available for                            emergencies. The signs and symptoms of potential                            delayed complications were discussed with the                            patient. Return to normal activities tomorrow.                            Written discharge instructions were provided to the                            patient.                           - Resume previous diet.                           - Continue present medications.                           - Await  pathology results.                           - Perform a colonoscopy today. Sandor Flatter, MD 04/12/2024 3:40:57 PM

## 2024-04-12 NOTE — Patient Instructions (Signed)
 Please read handouts provided. Continue present medications. Return to GI clinic as needed. Await pathology results. Resume previous diet. Consider hemorrhoid band ligation.   YOU HAD AN ENDOSCOPIC PROCEDURE TODAY AT THE [facility]:   Refer to the procedure report that was given to you for any specific questions about what was found during the examination.  If the procedure report does not answer your questions, please call your gastroenterologist to clarify.  If you requested that your care partner not be given the details of your procedure findings, then the procedure report has been included in a sealed envelope for you to review at your convenience later.  YOU SHOULD EXPECT: Some feelings of bloating in the abdomen. Passage of more gas than usual.  Walking can help get rid of the air that was put into your GI tract during the procedure and reduce the bloating. If you had a lower endoscopy (such as a colonoscopy or flexible sigmoidoscopy) you may notice spotting of blood in your stool or on the toilet paper. If you underwent a bowel prep for your procedure, you may not have a normal bowel movement for a few days.  Please Note:  You might notice some irritation and congestion in your nose or some drainage.  This is from the oxygen used during your procedure.  There is no need for concern and it should clear up in a day or so.  SYMPTOMS TO REPORT IMMEDIATELY:  Following lower endoscopy (colonoscopy or flexible sigmoidoscopy):  Excessive amounts of blood in the stool  Significant tenderness or worsening of abdominal pains  Swelling of the abdomen that is new, acute  Fever of 100F or higher  Following upper endoscopy (EGD)  Vomiting of blood or coffee ground material  New chest pain or pain under the shoulder blades  Painful or persistently difficult swallowing  New shortness of breath  Fever of 100F or higher  Black, tarry-looking stools  For urgent or emergent issues, a  gastroenterologist can be reached at any hour by calling [phone]. Do not use MyChart messaging for urgent concerns.    DIET:  We do recommend a small meal at first, but then you may proceed to your regular diet.  Drink plenty of fluids but you should avoid alcoholic beverages for 24 hours.  ACTIVITY:  You should plan to take it easy for the rest of today and you should NOT DRIVE or use heavy machinery until tomorrow (because of the sedation medicines used during the test).    FOLLOW UP: Our staff will call the number listed on your records the next business day following your procedure.  We will call around 7:15- 8:00 am to check on you and address any questions or concerns that you may have regarding the information given to you following your procedure. If we do not reach you, we will leave a message.     If any biopsies were taken you will be contacted by phone or by letter within the next 1-3 weeks.  Please call us  at [phone] if you have not heard about the biopsies in 3 weeks.    SIGNATURES/CONFIDENTIALITY: You and/or your care partner have signed paperwork which will be entered into your electronic medical record.  These signatures attest to the fact that that the information above on your After Visit Summary has been reviewed and is understood.  Full responsibility of the confidentiality of this discharge information lies with you and/or your care-partner.

## 2024-04-12 NOTE — Progress Notes (Signed)
 GASTROENTEROLOGY PROCEDURE H&P NOTE   Primary Care Physician: Pcp, No    Reason for Procedure:  GERD, regurgitation, heartburn, RUQ pain, hematochezia, microcytosis, B12 deficiency  Plan:    EGD, colonoscopy  Patient is appropriate for endoscopic procedure(s) in the ambulatory ([facility]) setting.  The nature of the procedure, as well as the risks, benefits, and alternatives were carefully and thoroughly reviewed with the patient. Ample time for discussion and questions allowed. The patient understood, was satisfied, and agreed to proceed. I personally addressed all patient questions and concerns.     HPI: Wayne Bartlett is a 58 y.o. male who presents for EGD and colonoscopy for evaluation of multiple GI symptoms, to include episodic hematochezia, microcytosis, GERD, heartburn, regurgitation, RUQ pain.  Patient was most recently seen in the Gastroenterology Clinic on 03/16/2024.  Labs at that time with B12 deficiency but no iron deficiency.  RUQ ultrasound with hepatic steatosis.  Otherwise no interval change in medical history since that appointment. Please refer to that note for full details regarding GI history and clinical presentation.   Past Medical History:  Diagnosis Date   Arthritis    Chronic headaches    GERD (gastroesophageal reflux disease)    Hypertension    PTSD (post-traumatic stress disorder)     Past Surgical History:  Procedure Laterality Date   FOOT SURGERY     NECK SURGERY     SHOULDER SURGERY      Prior to Admission medications   Medication Sig Start Date End Date Taking? Authorizing Provider  Cholecalciferol (VITAMIN D-1000 MAX ST) 25 MCG (1000 UT) tablet Take 1,000 Units by mouth. 09/09/23  Yes [provider]  naloxone (NARCAN) nasal spray 4 mg/0.1 mL Place into the nose. 08/24/19  Yes [provider]  oxyCODONE-acetaminophen  (PERCOCET) 10-325 MG tablet Take 1 tablet by mouth every 4 (four) hours as needed for pain.   Yes [provider]  potassium phosphate, monobasic, (K-PHOS ORIGINAL) 500 MG tablet Take 500 mg by mouth 4 (four) times daily -  with meals and at bedtime.    [provider]    Current Outpatient Medications  Medication Sig Dispense Refill   Cholecalciferol (VITAMIN D-1000 MAX ST) 25 MCG (1000 UT) tablet Take 1,000 Units by mouth.     naloxone (NARCAN) nasal spray 4 mg/0.1 mL Place into the nose.     oxyCODONE-acetaminophen  (PERCOCET) 10-325 MG tablet Take 1 tablet by mouth every 4 (four) hours as needed for pain.     potassium phosphate, monobasic, (K-PHOS ORIGINAL) 500 MG tablet Take 500 mg by mouth 4 (four) times daily -  with meals and at bedtime.     Current Facility-Administered Medications  Medication Dose Route Frequency Provider Last Rate Last Admin   0.9 %  sodium chloride infusion  500 mL Intravenous Once Eldin Bonsell V, DO        Allergies as of 04/12/2024 - Review Complete 03/16/2024  Allergen Reaction Noted   Diazepam Other (See Comments) 10/09/2019   Indomethacin Other (See Comments) 06/28/2003    Family History  Problem Relation Age of Onset   Alzheimer's disease Mother    Stroke Father     Social History   Socioeconomic History   Marital status: Married    Spouse name: Not on file   Number of children: Not on file   Years of education: Not on file   Highest education level: Not on file  Occupational History   Not on file  Tobacco Use  Smoking status: Every Day    Types: Cigarettes, Cigars   Smokeless tobacco: Never  Substance and Sexual Activity   Alcohol use: Yes   Drug use: Not on file   Sexual activity: Not on file  Other Topics Concern   Not on file  Social History Narrative   Not on file   Social Drivers of Health   Financial Resource Strain: Not on file  Food Insecurity: Not on file  Transportation Needs: Not on file  Physical Activity: Not on file  Stress: Not on file  Social Connections: Not on file  Intimate Partner  Violence: Not on file    Physical Exam: Vital signs in last 24 hours: @BP  (!) 142/87   Pulse (!) 102   Temp 97.9 F (36.6 C)   Ht 6' 1 (1.854 m)   Wt 224 lb (101.6 kg)   SpO2 99%   BMI 29.55 kg/m  GEN: NAD EYE: Sclerae anicteric ENT: MMM CV: Non-tachycardic Pulm: CTA b/l GI: Soft, NT/ND NEURO:  Alert & Oriented x 3   Sandor Flatter, DO [facility]   04/12/2024 2:58 PM

## 2024-04-12 NOTE — Progress Notes (Signed)
 Vss nad trans to pacu

## 2024-04-13 ENCOUNTER — Telehealth: Payer: Self-pay

## 2024-04-13 NOTE — Telephone Encounter (Signed)
 Pt returned call and stated that he is doing well. Pt requested call back only if needed. Thank you.

## 2024-04-13 NOTE — Telephone Encounter (Signed)
 No answer, left message to call if having any issues or concerns, B.Vester Titsworth RN

## 2024-04-17 LAB — SURGICAL PATHOLOGY

## 2024-04-18 ENCOUNTER — Ambulatory Visit: Payer: Self-pay | Admitting: Gastroenterology

## 2024-04-18 IMAGING — MR MR SHOULDER*R* W/O CM
5 series · 40 of 40 positions shown · non-contrast
Comparison: None Available.

CLINICAL DATA: Chronic right rotator cuff tear.  No prior surgery.

EXAM:
MRI OF THE RIGHT SHOULDER WITHOUT CONTRAST
TECHNIQUE: Multiplanar, multisequence MR imaging of the shoulder was performed.
No intravenous contrast was administered.

[Series 5: T2 fat-sat · axial · 4.0mm · 0.62mm/px · z∈[-22,+96]mm · 8 of 28 slices shown (1 of 3)]
[im 1/28]
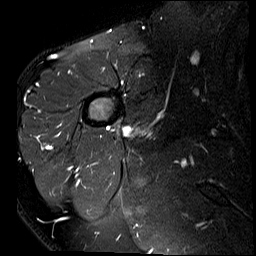
[im 4/28]
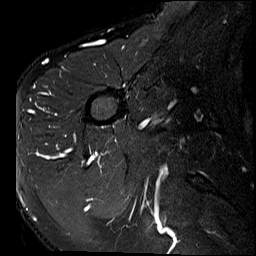
[im 8/28]
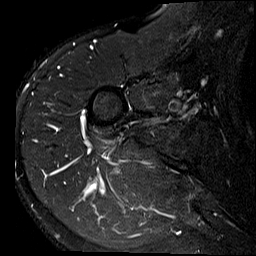
[im 12/28]
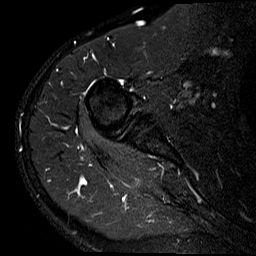
[im 16/28]
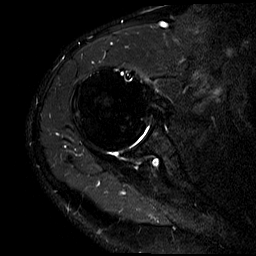
[im 20/28]
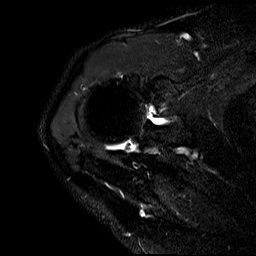
[im 24/28]
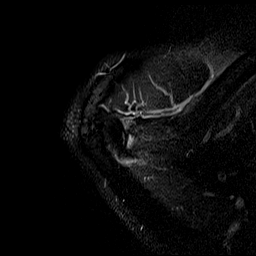
[im 28/28]
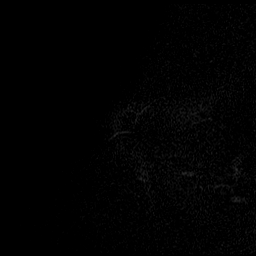

[Series 6: T2 fat-sat · oblique · 4.0mm · 0.62mm/px · 8 of 25 slices shown (2 of 3)]
[im 1/25]
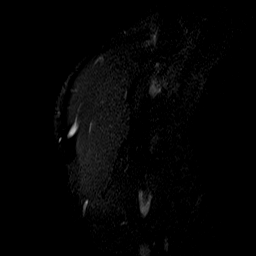
[im 4/25]
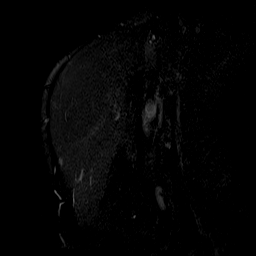
[im 7/25]
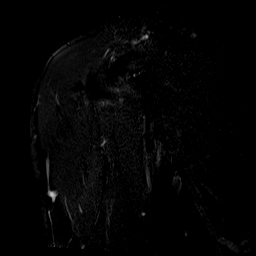
[im 11/25]
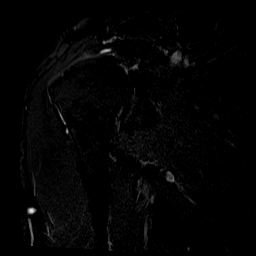
[im 14/25]
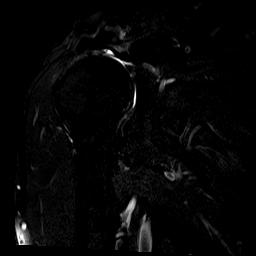
[im 18/25]
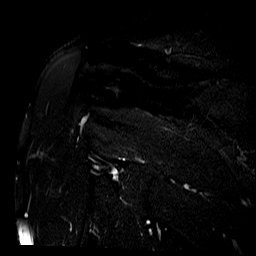
[im 21/25]
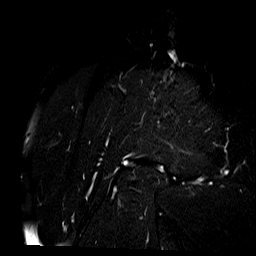
[im 25/25]
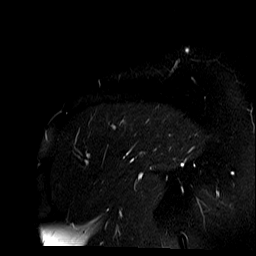

[Series 7: PD · oblique · 4.0mm · 0.62mm/px · 8 of 25 slices shown]
[im 1/25]
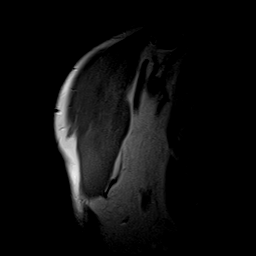
[im 4/25]
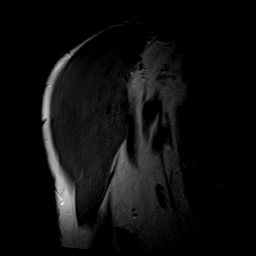
[im 7/25]
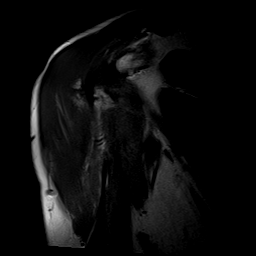
[im 11/25]
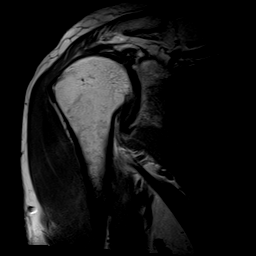
[im 14/25]
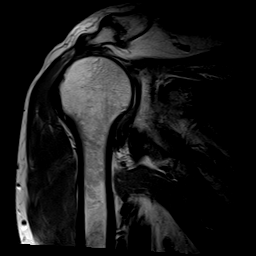
[im 18/25]
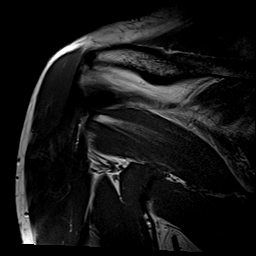
[im 21/25]
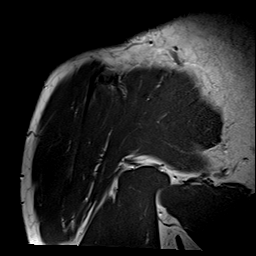
[im 25/25]
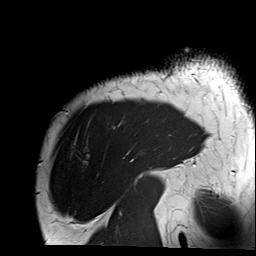

[Series 8: T2 fat-sat · oblique · 4.0mm · 0.59mm/px · 8 of 25 slices shown (3 of 3)]
[im 1/25]
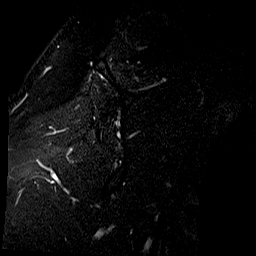
[im 4/25]
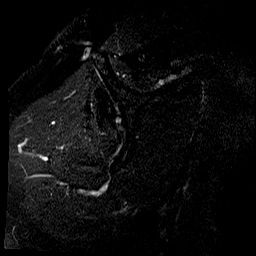
[im 7/25]
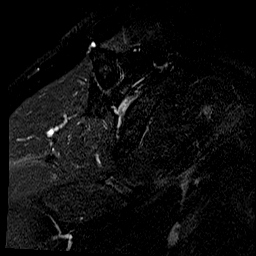
[im 11/25]
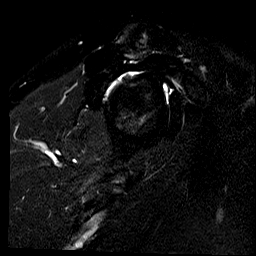
[im 14/25]
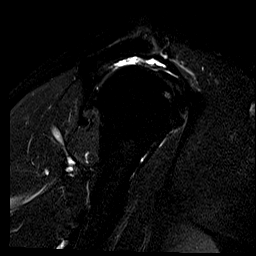
[im 18/25]
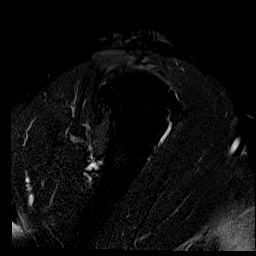
[im 21/25]
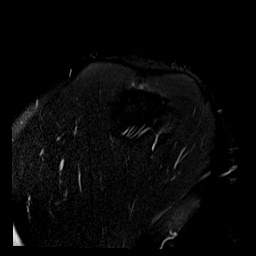
[im 25/25]
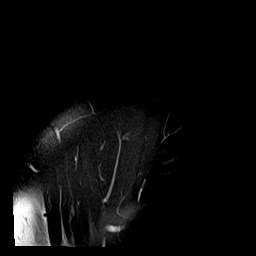

[Series 9: T1 · oblique · 4.0mm · 0.59mm/px · 8 of 25 slices shown]
[im 1/25]
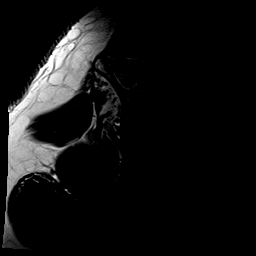
[im 4/25]
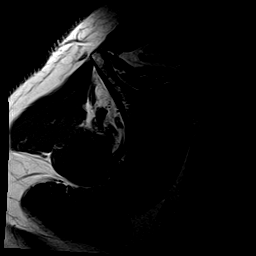
[im 7/25]
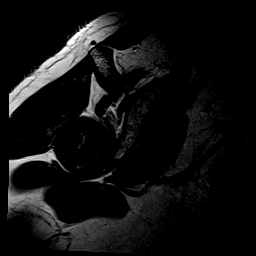
[im 11/25]
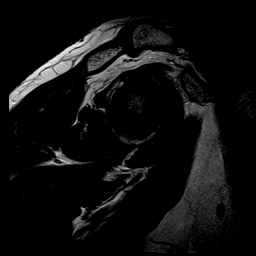
[im 14/25]
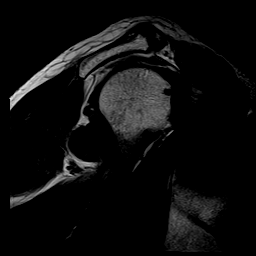
[im 18/25]
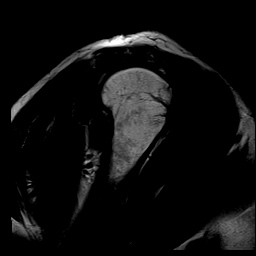
[im 21/25]
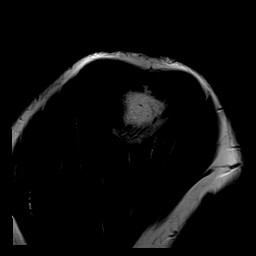
[im 25/25]
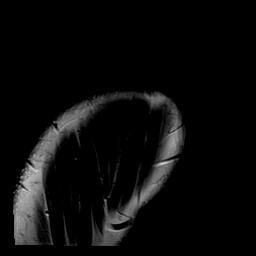

[40 of 40 positions shown; findings below may reference images not displayed]

FINDINGS: Rotator cuff: The humeral head is high-riding. There is a massive
full-thickness tear of the entire supraspinatus tendon and
approximate the anterior 50% of the infraspinatus tendon. There is
at least approximally 2.7 cm retraction of the supraspinatus tendon
fibers to the humeral head apex with up to 3.8 cm retraction of
other supraspinatus tendon fibers. Mild superior subscapularis
intermediate T2 signal tendinosis. The teres minor is intact.

Muscles:  Severe supraspinatus and infraspinatus muscle atrophy.

Biceps long head: Minimal linear signal within the lateral aspect of
the long head of the biceps tendon within the bicipital groove
longitudinal tear.

Acromioclavicular Joint: There are moderate degenerative changes of
the acromioclavicular joint including joint space narrowing,
subchondral marrow edema, and peripheral osteophytosis. Type II
acromion. Moderate lateral downsloping of the acromion on coronal
images with moderate distal lateral subacromial spurring.

Glenohumeral Joint: Mild-to-moderate thinning of the glenoid and
humeral head cartilage.

Labrum: Grossly intact, but evaluation is limited by lack of
intraarticular fluid.

Bones: Mild chronic subcortical cystic changes within the anterior
superior medial humeral head (axial image 10 and coronal image 16).
No acute fracture.

Other: None.
IMPRESSION: 1. Massive full-thickness tear of the entire supraspinatus tendon
and the anterior 50% of the infraspinatus tendon. High-grade muscle
atrophy.
2. Moderate acromioclavicular osteoarthritis.
3. Mild-to-moderate thinning of the glenoid and humeral head
cartilage.
